# Patient Record
Sex: Male | Born: 1985
Health system: Southern US, Community
[De-identification: ages and names within clinical notes are randomized; demographics above are authoritative.]

## PROBLEM LIST (undated history)

## (undated) DIAGNOSIS — E162 Hypoglycemia, unspecified: Secondary | ICD-10-CM

## (undated) DIAGNOSIS — B20 Human immunodeficiency virus [HIV] disease: Secondary | ICD-10-CM

## (undated) DIAGNOSIS — F1290 Cannabis use, unspecified, uncomplicated: Secondary | ICD-10-CM

## (undated) DIAGNOSIS — T7840XA Allergy, unspecified, initial encounter: Secondary | ICD-10-CM

## (undated) HISTORY — DX: Human immunodeficiency virus (HIV) disease: B20

## (undated) HISTORY — DX: Cannabis use, unspecified, uncomplicated: F12.90

## (undated) HISTORY — DX: Hypoglycemia, unspecified: E16.2

## (undated) HISTORY — DX: Allergy, unspecified, initial encounter: T78.40XA

---
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
## 2007-02-20 DIAGNOSIS — J45990 Exercise induced bronchospasm: Secondary | ICD-10-CM

## 2007-11-04 ENCOUNTER — Encounter: Payer: Self-pay | Admitting: *Deleted

## 2007-11-24 ENCOUNTER — Encounter: Admission: RE | Admit: 2007-11-24 | Discharge: 2007-11-24 | Payer: Self-pay | Admitting: Family Medicine

## 2007-11-24 ENCOUNTER — Ambulatory Visit: Payer: Self-pay | Admitting: Family Medicine

## 2008-05-01 ENCOUNTER — Emergency Department (HOSPITAL_COMMUNITY): Admission: EM | Admit: 2008-05-01 | Discharge: 2008-05-01 | Payer: Self-pay | Admitting: Emergency Medicine

## 2008-08-10 ENCOUNTER — Ambulatory Visit: Payer: Self-pay | Admitting: Family Medicine

## 2008-08-10 DIAGNOSIS — L708 Other acne: Secondary | ICD-10-CM

## 2008-08-10 DIAGNOSIS — L709 Acne, unspecified: Secondary | ICD-10-CM | POA: Insufficient documentation

## 2008-09-13 ENCOUNTER — Emergency Department (HOSPITAL_COMMUNITY): Admission: EM | Admit: 2008-09-13 | Discharge: 2008-09-13 | Payer: Self-pay | Admitting: Emergency Medicine

## 2010-05-30 ENCOUNTER — Encounter: Payer: Self-pay | Admitting: Family Medicine

## 2010-05-30 ENCOUNTER — Ambulatory Visit: Payer: Self-pay | Admitting: Family Medicine

## 2010-05-30 DIAGNOSIS — R5381 Other malaise: Secondary | ICD-10-CM | POA: Insufficient documentation

## 2010-05-30 DIAGNOSIS — M549 Dorsalgia, unspecified: Secondary | ICD-10-CM | POA: Insufficient documentation

## 2010-05-30 DIAGNOSIS — L03317 Cellulitis of buttock: Secondary | ICD-10-CM

## 2010-05-30 DIAGNOSIS — R5383 Other fatigue: Secondary | ICD-10-CM

## 2010-05-30 DIAGNOSIS — L0231 Cutaneous abscess of buttock: Secondary | ICD-10-CM

## 2010-05-30 LAB — CONVERTED CEMR LAB
ALT: 14 units/L (ref 0–53)
AST: 16 units/L (ref 0–37)
Albumin: 4.2 g/dL (ref 3.5–5.2)
Alkaline Phosphatase: 62 units/L (ref 39–117)
BUN: 15 mg/dL (ref 6–23)
Basophils Absolute: 0 10*3/uL (ref 0.0–0.1)
Basophils Relative: 0 % (ref 0–1)
CO2: 21 meq/L (ref 19–32)
Calcium: 8.9 mg/dL (ref 8.4–10.5)
Chlamydia, DNA Probe: NEGATIVE
Chloride: 105 meq/L (ref 96–112)
Creatinine, Ser: 0.9 mg/dL (ref 0.40–1.50)
Eosinophils Absolute: 0.4 10*3/uL (ref 0.0–0.7)
Eosinophils Relative: 3 % (ref 0–5)
GC Probe Amp, Genital: NEGATIVE
Glucose, Bld: 83 mg/dL (ref 70–99)
HCT: 44.1 % (ref 39.0–52.0)
Hemoglobin: 14.2 g/dL (ref 13.0–17.0)
Lymphocytes Relative: 15 % (ref 12–46)
Lymphs Abs: 1.7 10*3/uL (ref 0.7–4.0)
MCHC: 32.2 g/dL (ref 30.0–36.0)
MCV: 82.9 fL (ref 78.0–100.0)
Monocytes Absolute: 1 10*3/uL (ref 0.1–1.0)
Monocytes Relative: 9 % (ref 3–12)
Neutro Abs: 8.2 10*3/uL — ABNORMAL HIGH (ref 1.7–7.7)
Neutrophils Relative %: 72 % (ref 43–77)
Potassium: 4.2 meq/L (ref 3.5–5.3)
RBC: 5.32 M/uL (ref 4.22–5.81)
RDW: 14.3 % (ref 11.5–15.5)
Sodium: 140 meq/L (ref 135–145)
Total Bilirubin: 0.6 mg/dL (ref 0.3–1.2)
Total Protein: 7 g/dL (ref 6.0–8.3)
WBC: 11.4 10*3/uL — ABNORMAL HIGH (ref 4.0–10.5)

## 2010-09-21 ENCOUNTER — Encounter: Payer: Self-pay | Admitting: Family Medicine

## 2010-10-31 ENCOUNTER — Telehealth (INDEPENDENT_AMBULATORY_CARE_PROVIDER_SITE_OTHER): Payer: Self-pay | Admitting: *Deleted

## 2010-11-01 ENCOUNTER — Emergency Department (HOSPITAL_COMMUNITY): Admission: EM | Admit: 2010-11-01 | Discharge: 2010-11-01 | Payer: Self-pay | Admitting: Family Medicine
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
## 2011-01-23 NOTE — Miscellaneous (Signed)
   Clinical Lists Changes  Problems: Changed problem from ASTHMA, UNSPECIFIED (ICD-493.90) to ASTHMA, INTERMITTENT (ICD-493.90) 

## 2011-01-23 NOTE — Miscellaneous (Signed)
Summary: Consent I&D of abscess  Consent I&D of abscess   Imported By: Clydell Hakim 06/02/2010 15:36:58  _____________________________________________________________________  External Attachment:    Type:   Image     Comment:   External Document

## 2011-01-23 NOTE — Progress Notes (Signed)
   Phone Note Call from Patient   Caller: Patient Call For: [phone] Summary of Call: Pt would like to have lab results from appt in June Initial call taken by: Abundio Miu,  October 31, 2010 8:37 AM  Follow-up for Phone Call        called back line busy. Follow-up by: Theresia Lo RN,  October 31, 2010 2:37 PM  Additional Follow-up for Phone Call Additional follow up Details #1::        spoke with patient and advised that labs from June were within normal range and  other labs were negative for GC and chlymadia. he states he has  cyst that have formed in groin area againwith much pain . advised patient that he needs to be seen today. he is on his way to work and cannot come today.  he has no insurance and I checked with Denny Peon in front office and she advises we will not be able to see as he was given [facility] information at last visit and has not  paid any on last office visit, exlpained to patient that he will need to go to urgent care . advised to get in touch with Jaynee Eagles or apply for medicaid.. , again advised to go to urgent care today. Additional Follow-up by: Theresia Lo RN,  October 31, 2010 3:07 PM

## 2011-03-01 ENCOUNTER — Inpatient Hospital Stay (INDEPENDENT_AMBULATORY_CARE_PROVIDER_SITE_OTHER)
Admission: RE | Admit: 2011-03-01 | Discharge: 2011-03-01 | Disposition: A | Discharge status: Home / Self Care | Payer: Self-pay | Source: Ambulatory Visit | Attending: Family Medicine | Admitting: Family Medicine

## 2011-03-01 DIAGNOSIS — L02219 Cutaneous abscess of trunk, unspecified: Secondary | ICD-10-CM

## 2011-03-01 DIAGNOSIS — H1089 Other conjunctivitis: Secondary | ICD-10-CM

## 2011-03-01 DIAGNOSIS — J069 Acute upper respiratory infection, unspecified: Secondary | ICD-10-CM

## 2011-03-04 LAB — CULTURE, ROUTINE-ABSCESS

## 2011-03-06 LAB — CULTURE, ROUTINE-ABSCESS

## 2011-04-25 ENCOUNTER — Inpatient Hospital Stay (INDEPENDENT_AMBULATORY_CARE_PROVIDER_SITE_OTHER)
Admission: RE | Admit: 2011-04-25 | Discharge: 2011-04-25 | Disposition: A | Discharge status: Home / Self Care | Payer: Self-pay | Source: Ambulatory Visit | Attending: Family Medicine | Admitting: Family Medicine

## 2011-04-25 DIAGNOSIS — L989 Disorder of the skin and subcutaneous tissue, unspecified: Secondary | ICD-10-CM

## 2011-04-25 DIAGNOSIS — L0292 Furuncle, unspecified: Secondary | ICD-10-CM

## 2011-04-25 LAB — HIV ANTIBODY (ROUTINE TESTING W REFLEX): HIV: NONREACTIVE

## 2011-04-26 LAB — GC/CHLAMYDIA PROBE AMP, GENITAL
Chlamydia, DNA Probe: NEGATIVE
GC Probe Amp, Genital: NEGATIVE

## 2011-04-27 LAB — HERPES SIMPLEX VIRUS CULTURE: Culture: DETECTED

## 2011-05-08 ENCOUNTER — Inpatient Hospital Stay (INDEPENDENT_AMBULATORY_CARE_PROVIDER_SITE_OTHER)
Admission: RE | Admit: 2011-05-08 | Discharge: 2011-05-08 | Disposition: A | Discharge status: Home / Self Care | Payer: Self-pay | Source: Ambulatory Visit | Attending: Family Medicine | Admitting: Family Medicine

## 2011-05-08 DIAGNOSIS — L03319 Cellulitis of trunk, unspecified: Secondary | ICD-10-CM

## 2011-05-11 LAB — CULTURE, ROUTINE-ABSCESS

## 2011-05-11 NOTE — Discharge Summary (Signed)
NAMECHRISTYAN, [name]                        ACCOUNT NO.:  [id number]   MEDICAL RECORD NO.:  [id number]                   PATIENT TYPE:  INP   LOCATION:  6121                                 FACILITY:  [facility]   PHYSICIAN:  Leighton Roach McDiarmid, M.D.             DATE OF BIRTH:  [birth year]   DATE OF ADMISSION:  08/31/2003  DATE OF DISCHARGE:  09/02/2003                                 DISCHARGE SUMMARY   DISCHARGE DIAGNOSIS:  Acute asthma exacerbation.   DISCHARGE MEDICATIONS:  1. Singulair 10 mg p.o. q.h.s.  2. Prednisone 40 mg p.o. b.i.d. on September 03, 2003 and September 04, 2003     to complete a five day course.  3. Advair 250/50 Diskus one puff b.i.d.  4. Flonase nasal spray one spray each nostril q.a.m.  5. Albuterol MDI one to two puffs p.r.n. shortness of breath.   ACTIVITY:  As tolerated.   DIET:  No restrictions.   SPECIAL INSTRUCTIONS:  Check peak flows every morning. Record them and bring  these records to your doctor's appointments. Followup with Dr. Linford Arnold on  Thursday, September 16 at 11:35 a.m. at the [facility].   BRIEF ADMISSION HISTORY:  The patient is a 25 year old black male with a  history of asthma who presented to [facility] with a three day  history of increased shortness of breath, cough, wheezing, and decreased  activity. He started having URI symptoms four days prior to admission. He  was seen in the ER the night prior to admission, received nebulizer  treatments, started on prednisone, instructed to restart his Singulair, and  was discharged home. He had been using his albuterol nebulizers every two  hours at home prior to being seen in the ER. At the [facility],  he was initially found to have an O2 saturation of 86% on room air, received  albuterol and Atrovent nebulizer treatments x3 with improvement to 90 to 91%  on room air. Peak flows initially were 70, 90, 120, and only improved to 135  after three  nebulizers. Denied fever, chills, nausea, vomiting, diarrhea.  Talked about the people who smoke outside the home and had recently been  around more smokers.   On exam, he was breathing 30 times a minute, pulse of 110. He appeared  nontoxic but tired. His TMs were pearly gray. He had minimal posterior  oropharyngeal erythema, moist mucous membranes. No lymphadenopathy. Diffuse  wheezes with decreased breath sounds that only slightly improved after  nebulizer treatment. He had fair respiratory effort and minimal distress. A  rapid strep done in clinic was negative. As he had minimal improvement  status post three nebulizers, he was admitted for monitoring and treatment.   HOSPITAL COURSE:  The day of admission and over the first night, the patient  required frequent nebulizer treatments and O2 by face mask to maintain  saturations in the low 90s. He  was continued on prednisone. It was deemed  that his asthma exacerbation was likely secondary to a viral URI. He  continued to improve clinically and tolerated spacing out of his nebulizer  treatments. His pre and post nebulizer treatment peak flows continued to  improve, so that by the day of discharge, his peak flows were in his green  zone on his asthma action plan. Throughout his hospitalization, he remained  afebrile, and on the day of discharge, he was saturating 99% on room air  with end-expiratory wheezes but good air flow and no respiratory distress.  He was deemed ready for discharge.  During hospitalization, he and his mother did receive further asthma  education. He was instructed on peak flows and correct usage of his asthma  medication. He did have an albuterol MDI with spacer at home and therefore  was only discharged with a MDI and instructed to use his at-home spacer.      Georgina Peer, M.D.                 Etta Grandchild, M.D.    JM/MEDQ  D:  10/18/2003  T:  10/19/2003  Job:  [id number]

## 2011-06-07 ENCOUNTER — Inpatient Hospital Stay (INDEPENDENT_AMBULATORY_CARE_PROVIDER_SITE_OTHER)
Admission: RE | Admit: 2011-06-07 | Discharge: 2011-06-07 | Disposition: A | Discharge status: Home / Self Care | Payer: Self-pay | Source: Ambulatory Visit | Attending: Family Medicine | Admitting: Family Medicine

## 2011-06-07 DIAGNOSIS — L02419 Cutaneous abscess of limb, unspecified: Secondary | ICD-10-CM

## 2011-06-10 LAB — CULTURE, ROUTINE-ABSCESS

## 2012-02-28 ENCOUNTER — Other Ambulatory Visit: Payer: Self-pay | Admitting: Family Medicine

## 2012-04-18 ENCOUNTER — Encounter: Payer: Self-pay | Admitting: Family Medicine

## 2012-04-18 ENCOUNTER — Ambulatory Visit (INDEPENDENT_AMBULATORY_CARE_PROVIDER_SITE_OTHER): Payer: BC Managed Care – PPO | Admitting: Family Medicine

## 2012-04-18 VITALS — BP 131/80 | HR 88 | Ht 65.0 in | Wt 158.0 lb

## 2012-04-18 DIAGNOSIS — Z0000 Encounter for general adult medical examination without abnormal findings: Secondary | ICD-10-CM

## 2012-04-18 DIAGNOSIS — J45909 Unspecified asthma, uncomplicated: Secondary | ICD-10-CM

## 2012-04-18 DIAGNOSIS — J309 Allergic rhinitis, unspecified: Secondary | ICD-10-CM

## 2012-04-18 DIAGNOSIS — Z833 Family history of diabetes mellitus: Secondary | ICD-10-CM

## 2012-04-18 DIAGNOSIS — J302 Other seasonal allergic rhinitis: Secondary | ICD-10-CM

## 2012-04-18 DIAGNOSIS — Z113 Encounter for screening for infections with a predominantly sexual mode of transmission: Secondary | ICD-10-CM | POA: Insufficient documentation

## 2012-04-18 DIAGNOSIS — Z202 Contact with and (suspected) exposure to infections with a predominantly sexual mode of transmission: Secondary | ICD-10-CM

## 2012-04-18 LAB — BASIC METABOLIC PANEL
BUN: 17 mg/dL (ref 6–23)
CO2: 25 mEq/L (ref 19–32)
Glucose, Bld: 82 mg/dL (ref 70–99)
Potassium: 4.1 mEq/L (ref 3.5–5.3)
Sodium: 136 mEq/L (ref 135–145)

## 2012-04-18 MED ORDER — FLUTICASONE PROPIONATE 50 MCG/ACT NA SUSP: 2.0000 | Freq: Every day | NASAL | Status: DC

## 2012-04-18 MED ORDER — ALBUTEROL SULFATE HFA 108 (90 BASE) MCG/ACT IN AERS: 2.0000 | INHALATION_SPRAY | RESPIRATORY_TRACT | Status: DC | PRN

## 2012-04-18 MED ORDER — BECLOMETHASONE DIPROPIONATE 80 MCG/ACT IN AERS: 1.0000 | INHALATION_SPRAY | Freq: Two times a day (BID) | RESPIRATORY_TRACT | Status: DC

## 2012-04-18 NOTE — Assessment & Plan Note (Signed)
Well adult exam today. 1) asthma: Patient has active persistent asthma currently not ideally controlled. Plan to start Qvar and continue albuterol followup as needed 2) hypertension and diabetes screening. Blood pressure within normal range today. Patient is fasting. Will assess a basic metabolic panel. 3) STI: Potential for sexual transmitted infections. We'll check HIV and syphilis.  Discussed that these are in a body cast he will require subsequent testing in the future. Advised continued use of condoms he expresses understanding. 4) seasonal allergies: Flonase Zyrtec and ophthalmic antihistamine.  5) ingrown hair: Advised against shaving his legs.

## 2012-04-18 NOTE — Progress Notes (Signed)
[name] is a 26 y.o. male who presents to [facility] today for a physical.  1) patient notes worsening seasonal allergies and asthma recently: Patient has intermittent asthma and usually only has to use the albuterol inhaler one or 2 times a month. However over the last several weeks he has had use the albuterol inhaler twice a day. He denies any uncontrollable asthma but does have frequent wheezing. He additionally notes itchy watery eyes nasal discharge and sneezing. He feels well otherwise.  He is not on any controller medications.  2) screening for hypertension and diabetes: He has both hypertension and diabetes in first-degree relatives he would like to be assessed for this today. No chest pain palpitations or polyuria or polydipsia.  3) sexually transmitted infection evaluation: Patient has sex with both men and women almost always wears a condom. He does note that a partner of his recently tested positive for secondary syphilis. If that approximately one month since the sexual encounter with this individual and he would like to be assessed for both HIV and syphilis. He denies any penile discharge or sores in feels well otherwise.  4) ingrown hairs: Patient has had several ingrown hairs on his inner thighs.  He knows that they're often painful and sometimes get red and swollen. Currently they feel well. He does occasionally shave that area.    PMH: Significant for intermittent asthma.  PSH: None FH: Update electronic medical record significant for hypertension SH: Patient is a nonsmoker. He works at [employer]. Bisexual ROS as above: No chest pain palpitations fevers chills abdominal pain nausea vomiting or diarrhea Medications reviewed. Current Outpatient Prescriptions  Medication Sig Dispense Refill  . albuterol (PROAIR HFA) 108 (90 BASE) MCG/ACT inhaler Inhale 2 puffs into the lungs every 4 (four) hours as needed. For dyspnea.  1 Inhaler  3  . DISCONTD: albuterol (PROAIR HFA) 108  (90 BASE) MCG/ACT inhaler Inhale 2 puffs into the lungs every 4 (four) hours as needed. For dyspnea.       . beclomethasone (QVAR) 80 MCG/ACT inhaler Inhale 1 puff into the lungs 2 (two) times daily.  1 Inhaler  12  . fluticasone (FLONASE) 50 MCG/ACT nasal spray Place 2 sprays into the nose daily.  16 g  6    Exam:  BP 131/80  Pulse 88  Ht 5\' 5"  (1.651 m)  Wt 158 lb (71.668 kg)  BMI 26.29 kg/m2 Gen: Well NAD HEENT: EOMI,  MMM conjunctiva will injection mildly bilaterally without discharge Lungs: CTABL Nl WOB Heart: RRR no MRG Abd: NABS, NT, ND Exts: Non edematous BL  LE, warm and well perfused.  Skin: Thickened skin and scar is on the inner thigh characteristic of previous ingrown hairs. No erythema or induration.  Genitals: No penile sores or discharge.  No results found for this or any previous visit (from the past 72 hour(s)).

## 2012-04-18 NOTE — Patient Instructions (Signed)
Thank you for coming in today. FOr allergies. Please take zyrtec every day.  Also take flonase nasal spray and the [place]ofen eye drops daily. Look for antihistamine eye drops.  We will test for HIV and Syphillis. You should have an HIV test every 6 months.  You have ingrown hairs on your thighs. Do not shave that area.  For asthma. Please start QVAR twice a day. Let me know if this does not help.  I will call you about your test results. I will leave a message.

## 2012-04-21 ENCOUNTER — Telehealth: Payer: Self-pay | Admitting: Family Medicine

## 2012-04-21 NOTE — Telephone Encounter (Signed)
Called patient back about results

## 2012-07-19 ENCOUNTER — Emergency Department (HOSPITAL_COMMUNITY): Payer: BC Managed Care – PPO

## 2012-07-19 ENCOUNTER — Inpatient Hospital Stay (HOSPITAL_COMMUNITY)
Admission: EM | Admit: 2012-07-19 | Discharge: 2012-07-21 | DRG: 097 | Disposition: A | Discharge status: Home / Self Care | Payer: BC Managed Care – PPO | Attending: Family Medicine | Admitting: Family Medicine

## 2012-07-19 ENCOUNTER — Encounter (HOSPITAL_COMMUNITY): Payer: Self-pay | Admitting: *Deleted

## 2012-07-19 DIAGNOSIS — J45901 Unspecified asthma with (acute) exacerbation: Secondary | ICD-10-CM

## 2012-07-19 DIAGNOSIS — R0789 Other chest pain: Secondary | ICD-10-CM | POA: Diagnosis present

## 2012-07-19 DIAGNOSIS — R000 Tachycardia, unspecified: Secondary | ICD-10-CM

## 2012-07-19 DIAGNOSIS — J45990 Exercise induced bronchospasm: Secondary | ICD-10-CM | POA: Diagnosis present

## 2012-07-19 DIAGNOSIS — Z23 Encounter for immunization: Secondary | ICD-10-CM

## 2012-07-19 MED ORDER — ALBUTEROL SULFATE (5 MG/ML) 0.5% IN NEBU
5.0000 mg | INHALATION_SOLUTION | Freq: Once | RESPIRATORY_TRACT | Status: AC
  Administered 2012-07-19: 5 mg via RESPIRATORY_TRACT
  Filled 2012-07-19: qty 1

## 2012-07-19 NOTE — ED Notes (Signed)
Checked spo2 and HR at RN first- spo2 88-89% and HR 125; pt with exp wheezing; brought to triage and notified triage RN

## 2012-07-19 NOTE — ED Notes (Signed)
Pt states that he has a cold. Pt states hx of asthma. Pt ery tight breathing, pursed lips, pt able to talk with a few long breaths to continue talking. Pt alert and oriented.

## 2012-07-20 LAB — CBC WITH DIFFERENTIAL/PLATELET
Basophils Absolute: 0 10*3/uL (ref 0.0–0.1)
Basophils Relative: 0 % (ref 0–1)
Eosinophils Absolute: 0.3 10*3/uL (ref 0.0–0.7)
Eosinophils Relative: 3 % (ref 0–5)
HCT: 46.5 % (ref 39.0–52.0)
Lymphocytes Relative: 14 % (ref 12–46)
MCH: 27.8 pg (ref 26.0–34.0)
MCHC: 34.4 g/dL (ref 30.0–36.0)
MCV: 80.7 fL (ref 78.0–100.0)
Monocytes Absolute: 0.8 10*3/uL (ref 0.1–1.0)
Platelets: 127 10*3/uL — ABNORMAL LOW (ref 150–400)
RDW: 14.4 % (ref 11.5–15.5)

## 2012-07-20 LAB — BASIC METABOLIC PANEL
CO2: 25 mEq/L (ref 19–32)
Calcium: 9.3 mg/dL (ref 8.4–10.5)
Creatinine, Ser: 0.81 mg/dL (ref 0.50–1.35)
GFR calc non Af Amer: >90 mL/min (ref >90)

## 2012-07-20 LAB — MRSA PCR SCREENING: MRSA by PCR: NEGATIVE

## 2012-07-20 MED ORDER — PREDNISONE 20 MG PO TABS
60.0000 mg | ORAL_TABLET | Freq: Once | ORAL | Status: AC
  Administered 2012-07-20: 60 mg via ORAL
  Filled 2012-07-20: qty 3

## 2012-07-20 MED ORDER — IPRATROPIUM BROMIDE 0.02 % IN SOLN
0.5000 mg | Freq: Four times a day (QID) | RESPIRATORY_TRACT | Status: DC
Start: 2012-07-20 — End: 2012-07-21
  Administered 2012-07-20 – 2012-07-21 (×5): 0.5 mg via RESPIRATORY_TRACT
  Filled 2012-07-20 (×5): qty 2.5

## 2012-07-20 MED ORDER — ONDANSETRON HCL 4 MG/2ML IJ SOLN
4.0000 mg | Freq: Four times a day (QID) | INTRAMUSCULAR | Status: DC | PRN
Start: 2012-07-20 — End: 2012-07-21

## 2012-07-20 MED ORDER — ONDANSETRON HCL 4 MG PO TABS: 4.0000 mg | ORAL_TABLET | Freq: Four times a day (QID) | ORAL | Status: DC | PRN

## 2012-07-20 MED ORDER — PHENOL 1.4 % MT LIQD: 1.0000 | OROMUCOSAL | Status: DC | PRN

## 2012-07-20 MED ORDER — IPRATROPIUM BROMIDE 0.02 % IN SOLN
0.5000 mg | Freq: Once | RESPIRATORY_TRACT | Status: AC
  Administered 2012-07-20: 0.5 mg via RESPIRATORY_TRACT
  Filled 2012-07-20: qty 2.5

## 2012-07-20 MED ORDER — ENOXAPARIN SODIUM 40 MG/0.4ML ~~LOC~~ SOLN
40.0000 mg | Freq: Every day | SUBCUTANEOUS | Status: DC
  Administered 2012-07-20 – 2012-07-21 (×2): 40 mg via SUBCUTANEOUS
  Filled 2012-07-20 (×2): qty 0.4

## 2012-07-20 MED ORDER — MORPHINE SULFATE 2 MG/ML IJ SOLN: 1.0000 mg | INTRAMUSCULAR | Status: DC | PRN

## 2012-07-20 MED ORDER — ALBUTEROL SULFATE (5 MG/ML) 0.5% IN NEBU
2.5000 mg | INHALATION_SOLUTION | RESPIRATORY_TRACT | Status: DC | PRN
  Filled 2012-07-20: qty 1

## 2012-07-20 MED ORDER — ACETAMINOPHEN 650 MG RE SUPP: 650.0000 mg | Freq: Four times a day (QID) | RECTAL | Status: DC | PRN

## 2012-07-20 MED ORDER — PNEUMOCOCCAL VAC POLYVALENT 25 MCG/0.5ML IJ INJ
0.5000 mL | INJECTION | INTRAMUSCULAR | Status: AC
  Administered 2012-07-21: 0.5 mL via INTRAMUSCULAR
  Filled 2012-07-20: qty 0.5

## 2012-07-20 MED ORDER — KETOROLAC TROMETHAMINE 30 MG/ML IJ SOLN
30.0000 mg | Freq: Four times a day (QID) | INTRAMUSCULAR | Status: AC
  Administered 2012-07-20 (×3): 30 mg via INTRAVENOUS
  Filled 2012-07-20 (×3): qty 1

## 2012-07-20 MED ORDER — ALBUTEROL SULFATE (5 MG/ML) 0.5% IN NEBU
5.0000 mg | INHALATION_SOLUTION | RESPIRATORY_TRACT | Status: DC
  Administered 2012-07-20 – 2012-07-21 (×5): 5 mg via RESPIRATORY_TRACT
  Filled 2012-07-20 (×10): qty 0.5

## 2012-07-20 MED ORDER — ALBUTEROL SULFATE (5 MG/ML) 0.5% IN NEBU
5.0000 mg | INHALATION_SOLUTION | RESPIRATORY_TRACT | Status: DC | PRN
  Administered 2012-07-20 – 2012-07-21 (×4): 5 mg via RESPIRATORY_TRACT
  Filled 2012-07-20: qty 0.5
  Filled 2012-07-20: qty 1

## 2012-07-20 MED ORDER — ACETAMINOPHEN 325 MG PO TABS: 650.0000 mg | ORAL_TABLET | Freq: Four times a day (QID) | ORAL | Status: DC | PRN

## 2012-07-20 MED ORDER — SODIUM CHLORIDE 0.9 % IJ SOLN
3.0000 mL | Freq: Two times a day (BID) | INTRAMUSCULAR | Status: DC
  Administered 2012-07-21: 3 mL via INTRAVENOUS

## 2012-07-20 MED ORDER — ALBUTEROL SULFATE (5 MG/ML) 0.5% IN NEBU
5.0000 mg | INHALATION_SOLUTION | Freq: Once | RESPIRATORY_TRACT | Status: AC
  Administered 2012-07-20: 5 mg via RESPIRATORY_TRACT
  Filled 2012-07-20: qty 1

## 2012-07-20 MED ORDER — ALBUTEROL (5 MG/ML) CONTINUOUS INHALATION SOLN
15.0000 mg/h | INHALATION_SOLUTION | RESPIRATORY_TRACT | Status: AC
  Administered 2012-07-20: 15 mg/h via RESPIRATORY_TRACT
  Filled 2012-07-20: qty 20

## 2012-07-20 MED ORDER — ALBUTEROL (5 MG/ML) CONTINUOUS INHALATION SOLN
10.0000 mg/h | INHALATION_SOLUTION | Freq: Once | RESPIRATORY_TRACT | Status: AC
  Administered 2012-07-20: 10 mg/h via RESPIRATORY_TRACT
  Filled 2012-07-20: qty 20

## 2012-07-20 MED ORDER — ALBUTEROL SULFATE (5 MG/ML) 0.5% IN NEBU
5.0000 mg | INHALATION_SOLUTION | RESPIRATORY_TRACT | Status: DC
  Administered 2012-07-20 (×6): 5 mg via RESPIRATORY_TRACT
  Filled 2012-07-20: qty 0.5
  Filled 2012-07-20: qty 1
  Filled 2012-07-20 (×2): qty 0.5

## 2012-07-20 MED ORDER — FLUTICASONE PROPIONATE HFA 44 MCG/ACT IN AERO
1.0000 | INHALATION_SPRAY | Freq: Two times a day (BID) | RESPIRATORY_TRACT | Status: DC
  Filled 2012-07-20 (×2): qty 10.6

## 2012-07-20 MED ORDER — POTASSIUM CHLORIDE IN NACL 20-0.9 MEQ/L-% IV SOLN
INTRAVENOUS | Status: DC
  Administered 2012-07-20: 05:00:00 via INTRAVENOUS
  Administered 2012-07-20: 125 mL/h via INTRAVENOUS
  Administered 2012-07-21 (×2): via INTRAVENOUS
  Filled 2012-07-20 (×8): qty 1000

## 2012-07-20 MED ORDER — ALBUTEROL SULFATE (5 MG/ML) 0.5% IN NEBU
5.0000 mg | INHALATION_SOLUTION | RESPIRATORY_TRACT | Status: DC
  Filled 2012-07-20: qty 1

## 2012-07-20 MED ORDER — PREDNISONE 50 MG PO TABS
50.0000 mg | ORAL_TABLET | Freq: Every day | ORAL | Status: DC
  Administered 2012-07-20 – 2012-07-21 (×2): 50 mg via ORAL
  Filled 2012-07-20 (×4): qty 1

## 2012-07-20 NOTE — ED Provider Notes (Signed)
History     CSN: [id number]  Arrival date & time 07/19/12  2321   First MD Initiated Contact with Patient 07/20/12 0021      Chief Complaint  Patient presents with  . Shortness of Breath  . Asthma    (Consider location/radiation/quality/duration/timing/severity/associated sxs/prior treatment) HPI 26 year old male with history of asthma presents to the emergency department with report of asthma exacerbation. Patient reports cold symptoms with dry scratchy throat for the last few days. He reports normally when he gets a cold he usually has a bad has a flare. Patient reports his asthma is usually only exercise-induced, and is controlled well with albuterol. He's been using his albuterol MDI without improvement in symptoms. No fever no chills. Patient denies smoking. Last admission was about 3 years ago.  Past Medical History  Diagnosis Date  . Allergy   . Asthma     History reviewed. No pertinent past surgical history.  Family History  Problem Relation Age of Onset  . Hypertension Mother     History  Substance Use Topics  . Smoking status: Never Smoker   . Smokeless tobacco: Never Used  . Alcohol Use: No      Review of Systems  All other systems reviewed and are negative.   other than listed in history of present illness  Allergies  Review of patient's allergies indicates no known allergies.  Home Medications   Current Outpatient Rx  Name Route Sig Dispense Refill  . ALBUTEROL SULFATE HFA 108 (90 BASE) MCG/ACT IN AERS Inhalation Inhale 2 puffs into the lungs every 4 (four) hours as needed. For dyspnea. 1 Inhaler 3    BP 144/80  Pulse 110  Temp 97.7 F (36.5 C) (Oral)  Resp 18  SpO2 89%  Physical Exam  Nursing note and vitals reviewed. Constitutional: He is oriented to person, place, and time. He appears well-developed and well-nourished. He appears distressed (moderate respiratory distress).  HENT:  Head: Normocephalic and atraumatic.  Mouth/Throat:  Oropharynx is clear and moist. No oropharyngeal exudate (Posterior oropharynx is erythematous).  Neck: Normal range of motion. Neck supple. No JVD present. No tracheal deviation present. No thyromegaly present.  Cardiovascular: Normal rate, regular rhythm, normal heart sounds and intact distal pulses.  Exam reveals no gallop and no friction rub.   No murmur heard. Pulmonary/Chest: No stridor. He is in respiratory distress (moderate , speaking in short sentences). He has wheezes. He has no rales. He exhibits no tenderness.  Abdominal: Soft. Bowel sounds are normal. He exhibits no distension and no mass. There is no tenderness. There is no rebound and no guarding.  Musculoskeletal: Normal range of motion. He exhibits no edema and no tenderness.  Lymphadenopathy:    He has no cervical adenopathy.  Neurological: He is alert and oriented to person, place, and time. He exhibits normal muscle tone. Coordination normal.  Skin: Skin is warm and dry. No rash noted. No erythema. No pallor.  Psychiatric: He has a normal mood and affect. His behavior is normal. Judgment and thought content normal.    ED Course  Procedures (including critical care time)   Labs Reviewed  CBC WITH DIFFERENTIAL  BASIC METABOLIC PANEL   Dg Chest 2 View  07/20/2012  *RADIOLOGY REPORT*  Clinical Data: Shortness of breath  CHEST - 2 VIEW  Comparison: 09/13/2008  Findings: Mild peribronchial thickening is similar to prior.  No focal consolidation.  No pleural effusion or pneumothorax. Cardiomediastinal contours within normal range.  No acute osseous finding.  IMPRESSION:  Mild peribronchial cuffing may reflect reactive airway disease or viral infection.  No focal consolidation.  Original Report Authenticated By: Waneta Martins, M.D.     1. Asthma exacerbation       MDM  26 year old male with asthma exacerbation. Patient has artery received one treatment prior to me seeing him, still with extensive wheezing and  decreased air exchange. Patient without improvement after second treatment. To be placed on hour-long continuous neb, will discuss with  family practice for admission.        Olivia Mackie, MD 07/20/12 [phone]

## 2012-07-20 NOTE — Progress Notes (Signed)
[facility]PT[facility] PGY-1 Update Note  Pt re-examined with Dr. Gwenlyn Saran. Pt states he feels much better and breathing is much improved. Would like to go home tonight but understands we want to watch him closely at least overnight.  Objectively, lung fields are much clearer to auscultation, with only occasional scattered wheezes. Pt does remain tachycardic in the 120's, but this is slightly improved and not entirely unexpected, given how much albuterol pt has had since early this morning. He has not required any PRN albuterol beyond his scheduled q2 dosing since 0400.  Plan for now is to transfer to tele floor (out of stepdown) and continue to monitor with likely discharge tomorrow. Will continue IS, O2 as needed, and albuterol q2/q1 with ipratropium q6. Fluticasone not administered this morning per [facility], scheduled for 2000, tonight then BID starting tomorrow.

## 2012-07-20 NOTE — Progress Notes (Signed)
07/20/12 0500 notified Dr Elwyn Reach patients hr in 130s to 140s, and complains of sob unrelieved by breathing treatments, orders received, will continue to monitor.  [name]

## 2012-07-20 NOTE — H&P (Signed)
FMTS Attending Admission Note: Samuel Levy MD [phone] pager office [phone] I  have seen and examined this patient, reviewed their chart. I have discussed this patient with the resident. I agree with the resident's findings, assessment and care plan. Asthma does not sound well controlled at home--he has been unable to afford his medication other than albuterol. He has cough every night and increased symptoms with exertion so we plan on exploring ways for him to get on controller med on d/c such as flovent. He will likely be following up with our practice. He anted to go home now but I feel he is not quite ready yet and will hold until AM. He will need a work note.

## 2012-07-20 NOTE — Progress Notes (Signed)
RT Note;15mg  cont neb x1 hr given,pt tol well.

## 2012-07-20 NOTE — Plan of Care (Signed)
Problem: Phase I Progression Outcomes Goal: Hemodynamically stable Outcome: Progressing Hr still in 120'-130's

## 2012-07-20 NOTE — Progress Notes (Signed)
FPTS PGY-1 Update Note  Pt seen at bedside. States he "still feels uncomfortable, but better than when he came in." Still feels wheezy but is able to breath better after breathing treatments; pt had completed second CAT about 1 hour before my visit. Otherwise denies any chest pain and is not particularly short of breath. No other complaints.  Also coincidentally introduced myself as his new PCP, now that Dr. Denyse Amass has graduated the [facility] residency. Pt was very pleasant and stated he looked forward to "staying healthy" with me.  Objective: Vitals: HR in high-120's, low-130's, pt on 2L [place] Gen: Pt sitting up in bed, in NAD, speaking in complete sentences Pulm: slightly increased work of breathing without retractions, mildly decreased airmovement with diffuse inspiratory and expiratory wheezes, worse in upper lung fields with some soft squeaks, clearer in lower lung fields Cardio: increased rate but no obvious murmur or gallop; distal pulses 2+ and intact/symmetric Abd: soft, non-tender, non-distended without guarding Ext: moves extremities equally, sits up in bed without support  Plan: 1. Asthma exacerbation - Hx of multiple admissions requiring intubation in the past, most recently 3 years ago. This exacerbation was ikely triggered by viral infection +/- weather; pt with hx of cold-like symptoms and cough prior to admission, with nighttime cough for the last few months. Pt describes asthma as usually exercise-induced but he does notice more trouble with allergies in spring/fall; not currently taking a controller medication. Hypoxic on admission with some improvement s/p nebulizers x2 and CAT x2 hours. Pt currently feels better with improved breathing but still "uncomfortable." PLAN - Continue Albuterol scheduled q2, PRN q1. Fluticasone 1 puff BID (new medication). Prednisone 50 mg for 5 days (last dose 8/1). O2 as needed. Incentive spirometry.  2. Musculoskeletal chest pain - Most likely due to  respiratory distress/increased work of breathing/tachypnea/etc. Currently no concern for cardiac source. EKG shows tachycardia but not suggestive for MI. PLAN - Toradol IV q6 x3, then switch to oral NSAID. Morphine 1 mg q4 PRN if not relieved by Toradol.  3. Tachycardia - Remains in 120's-130's, 7/28 as of 0915, but pt has received multiple albuterol nebulizers and CATx2. EKG as above. Will monitor.   FEN/GI: NPO for now, advance diet once breathing improves Prophylaxis: Lovenox Dispo: home pending improvement Code Status: Full Code  Bobbye Morton, MD PGY-1, [facility] FPTS Intern pager: [phone]

## 2012-07-20 NOTE — H&P (Signed)
Family Medicine Teaching [facility] Admission History and Physical  Patient name: [name] Medical record number: [id number] Date of birth: [birth year] Age: 26 y.o. Gender: male  Primary Care Provider: Maryjean Ka, MD  Chief Complaint: shortness of breath History of Present Illness: [name] is a 26 y.o. year old male with asthma presenting with a 1 day history of difficulty breathing and wheezing.  He states he was doing fine until 2 nights ago when he started having some cold-like symptoms including sore/scratchy throat and dry cough.  Yesterday, while at work, he reports subjective fever and co-workers telling him he looked "clammy."  He also started feeling short of breath and started wheezing.  Denies any nasal, eye, or ear symptoms.  Does have some diffuse chest pain that is worse when he takes a deep breath.  No radiation or associated nausea, dizziness.  No vomiting, diarrhea, abdominal pain, lower extremity swelling.  States that he has exercise induced asthma and only requires his home albuterol with exercise.  He does report seasonal allergies that tend to make his asthma worse.  Has had multiple hospitalizations and intubations in the past, most recently 3 years ago.  Denies current daytime symptoms, but does report waking pretty much every night recently with cough.  ED course: Hypoxic to 89% on arrival.  DuoNeb x1, albuterol neb x1, CAT x1hr, prednisone.  Patient Active Problem List  Diagnosis  . Asthma, persistent  . CELLULITIS AND ABSCESS OF BUTTOCK  . ACNE, MILD  . BACK PAIN  . FATIGUE  . Seasonal allergies  . Exposure to STD  . Family history of diabetes mellitus  . Well adult exam   Past Medical History: Past Medical History  Diagnosis Date  . Allergy   . Asthma     Past Surgical History: History reviewed. No pertinent past surgical history.  Social History: History   Social History  . Marital Status: Single    Spouse Name: N/A      Number of Children: N/A  . Years of Education: N/A   Social History Main Topics  . Smoking status: Never Smoker   . Smokeless tobacco: Never Used  . Alcohol Use: No  . Drug Use: No  . Sexually Active: No   Other Topics Concern  . None   Social History Narrative   Social history: Has sex with both men and women. Almost always uses a condom. Has had sexual contact with a man who has syphilis however he was using protection.His family notes that he is out and are okay with it.  He is relieved about that.  Does endorse marijuana use, most recently 2 weeks ago  Family History: Family History  Problem Relation Age of Onset  . Hypertension Mother     Allergies: No Known Allergies  Current Facility-Administered Medications  Medication Dose Route Frequency Provider Last Rate Last Dose  . albuterol (PROVENTIL) (5 MG/ML) 0.5% nebulizer solution 5 mg  5 mg Nebulization Once Olivia Mackie, MD   5 mg at 07/19/12 2333  . albuterol (PROVENTIL) (5 MG/ML) 0.5% nebulizer solution 5 mg  5 mg Nebulization Once Olivia Mackie, MD   5 mg at 07/20/12 0055  . albuterol (PROVENTIL,VENTOLIN) solution continuous neb  10 mg/hr Nebulization Once Olivia Mackie, MD   10 mg/hr at 07/20/12 0216  . ipratropium (ATROVENT) nebulizer solution 0.5 mg  0.5 mg Nebulization Once Olivia Mackie, MD   0.5 mg at 07/20/12 0054  . predniSONE (DELTASONE) tablet  60 mg  60 mg Oral Once Olivia Mackie, MD   60 mg at 07/20/12 1610   Current Outpatient Prescriptions  Medication Sig Dispense Refill  . albuterol (PROAIR HFA) 108 (90 BASE) MCG/ACT inhaler Inhale 2 puffs into the lungs every 4 (four) hours as needed. For dyspnea.  1 Inhaler  3   Review Of Systems: Per HPI with the following additions: none Otherwise 12 point review of systems was performed and was unremarkable.  Physical Exam: Pulse: 140  Blood Pressure: 126/69 RR: 28   O2: 99 on CAT Temp: 97.7  General: alert, cooperative, appears stated age, no distress and CAT  running HEENT: PERRLA, extra ocular movement intact, mild conjunctival injection, anicteric, oropharynx clear, no lesions, neck supple with midline trachea and no cervical LAD, mild pharyngeal erthyema Heart: tachycardic, S1, S2 normal, no murmur, rub or gallop, regular rhythm Lungs: normal work of breathing, mildly tachypneic, decreased air movement with inspiratory squeak in bilateral lung bases, good air movement in upper lung fields with no wheezes Abdomen: abdomen is soft without significant tenderness, masses, organomegaly or guarding Extremities: extremities normal, atraumatic, no cyanosis or edema and Homans sign is negative, no sign of DVT Skin:no rashes, no ecchymoses Neurology: normal without focal findings, mental status, speech normal, alert and oriented x3, PERLA and muscle tone and strength normal and symmetric  Labs and Imaging: Lab Results  Component Value Date/Time   NA 140 07/20/2012  2:09 AM   K 3.7 07/20/2012  2:09 AM   CL 103 07/20/2012  2:09 AM   CO2 25 07/20/2012  2:09 AM   BUN 14 07/20/2012  2:09 AM   CREATININE 0.81 07/20/2012  2:09 AM   CREATININE 0.90 04/18/2012  9:52 AM   GLUCOSE 108* 07/20/2012  2:09 AM   Lab Results  Component Value Date   WBC 8.7 07/20/2012   HGB 16.0 07/20/2012   HCT 46.5 07/20/2012   MCV 80.7 07/20/2012   PLT 127* 07/20/2012   CXR: Mild peribronchial cuffing may reflect reactive airway disease or  viral infection. No focal consolidation.  Assessment and Plan: [name] is a 26 y.o. year old male with known history of asthma presenting with asthma exacerbation.  # Asthma exacerbation: Likely triggered by viral infection.  Hypoxic on arrival to ED, some improvement with nebs x2, but did require 1 hour of CAT.  Asthma described as primarily exercise induced, but he does report having more trouble when his allergies start up in the spring and fall.  Multiple admissions and intubations in the past, most recently about 3 years ago.  No  controller at home; reports daily nighttime cough for the last few months. - admit to step down - albuterol q2/q1 - start fluticasone 1 puff BID - prednisone 50mg  daily x5 days (last dose on 8/1) - O2 as needed to keep sats >90% - IS q2hr while awake - chloraseptic spray as needed - will keep NPO for now given history of intubation  # MSK chest pain: Likely related to tachypnea and asthma exacerbation.  No red flags for cardiac source. - will give scheduled toradol x3 doses, then switch to oral anti-inflammatory - morphine 1mg  q4hr prn available if needed - will check EKG in the morning  # Tachycardia: Likely related to respiratory distress and albuterol.  Expect this will improve once requiring less albuterol. - will continue to monitor - EKG in the morning  FEN/GI: NPO except ice chips; NS + 20KCl at 125cc/hr Prophylaxis: SQ lovenox  Disposition: admit to stepdown  [name], [name] 07/20/2012, 3:04 AM

## 2012-07-21 MED ORDER — IBUPROFEN 400 MG PO TABS
400.0000 mg | ORAL_TABLET | Freq: Four times a day (QID) | ORAL | Status: DC | PRN
  Filled 2012-07-21: qty 1

## 2012-07-21 MED ORDER — ONDANSETRON HCL 4 MG PO TABS: 4.0000 mg | ORAL_TABLET | Freq: Four times a day (QID) | ORAL | Status: AC | PRN

## 2012-07-21 MED ORDER — FLUTICASONE PROPIONATE HFA 44 MCG/ACT IN AERO: 1.0000 | INHALATION_SPRAY | RESPIRATORY_TRACT | Status: DC

## 2012-07-21 MED ORDER — IPRATROPIUM BROMIDE 0.02 % IN SOLN
0.5000 mg | RESPIRATORY_TRACT | Status: DC
  Administered 2012-07-21 (×2): 0.5 mg via RESPIRATORY_TRACT
  Filled 2012-07-21 (×2): qty 2.5

## 2012-07-21 MED ORDER — FLUTICASONE PROPIONATE HFA 44 MCG/ACT IN AERO: 1.0000 | INHALATION_SPRAY | Freq: Two times a day (BID) | RESPIRATORY_TRACT | Status: DC

## 2012-07-21 MED ORDER — ALBUTEROL SULFATE (5 MG/ML) 0.5% IN NEBU: 5.0000 mg | INHALATION_SOLUTION | RESPIRATORY_TRACT | Status: DC

## 2012-07-21 MED ORDER — FLUTICASONE PROPIONATE HFA 44 MCG/ACT IN AERO
1.0000 | INHALATION_SPRAY | Freq: Two times a day (BID) | RESPIRATORY_TRACT | Status: DC
  Administered 2012-07-21: 1 via RESPIRATORY_TRACT
  Filled 2012-07-21: qty 10.6

## 2012-07-21 MED ORDER — IPRATROPIUM BROMIDE 0.02 % IN SOLN: 0.5000 mg | RESPIRATORY_TRACT | Status: DC

## 2012-07-21 MED ORDER — PREDNISONE 50 MG PO TABS: ORAL_TABLET | ORAL | Status: AC

## 2012-07-21 NOTE — Progress Notes (Signed)
Family Medicine Teaching Service Daily Progress Note Intern Pager: [phone]  Patient name: [name] Medical record number: [id number] Date of birth: Apr 20, 1986 Age: 26 y.o. Gender: male  Primary Care Provider: Woodrow Dulski, Cristal Deer, MD  Subjective: Pt seen at bedside. States again he would like to go home today, but understands he may have to stay overnight again if his breathing is not improved. Otherwise states he feels okay, with only occasional/slight shortness of breath, but does still endorse wheezing.  Objective: Temp:  [98.2 F (36.8 C)-98.3 F (36.8 C)] 98.3 F (36.8 C) (07/29 0531) Pulse Rate:  [88-123] 88  (07/29 0531) Resp:  [20-26] 20  (07/29 0531) BP: (124-136)/(68-86) 136/86 mmHg (07/29 0531) SpO2:  [92 %-99 %] 99 % (07/29 0733) Weight:  [147 lb 4.3 oz (66.8 kg)] 147 lb 4.3 oz (66.8 kg) (07/28 2039) Exam: Vitals: HR in low 100's on telemetry during my exam; 88 documented above earlier this morning Gen: Pt sitting up in bed, in NAD, speaking in complete sentences without being short of breath, on room air Pulm: diffuse expiratory wheezes with protracted expiratory phase; generally clearer exam with better air movement than yesterday Cardio: slightly increased rate but no obvious murmur or gallop; distal pulses 2+ and intact/symmetric  Abd: soft, non-tender, non-distended without guarding  Ext: moves extremities equally, sits up in bed/moves without support  Laboratory:  Lab 07/20/12 0209  WBC 8.7  HGB 16.0  HCT 46.5  PLT 127*    Lab 07/20/12 0209  NA 140  K 3.7  CL 103  CO2 25  BUN 14  CREATININE 0.81  LABGLOM --  GLUCOSE 108*  CALCIUM 9.3   Imaging/Diagnostic Tests: CXR, 7/27 2@2357  IMPRESSION:  Mild peribronchial cuffing may reflect reactive airway disease or  viral infection. No focal consolidation  Assessment and Plan: [name] is a 26yo male with PMH significant for persistent asthma who presented with an acute exacerbation of his  asthma with a 1-day history of shortness of breath and wheezing, with cold-like symptoms for 2-3 days.  1. Asthma exacerbation - Hx of multiple admissions (some requiring intubation) in the past, most recently 3 years ago. This exacerbation was ikely triggered by viral infection +/- weather; pt with hx of cold-like symptoms and cough prior to admission, with nighttime cough for the last few months. Pt describes asthma as usually exercise-induced but he does notice more trouble with allergies in spring/fall; not currently taking a controller medication. Hypoxic on admission with some improvement s/p nebulizers x2 and CAT x2 hours. Pt still with some subjective shortness of breath and wheezing, exam as above. PLAN - Continue Albuterol scheduled q4, PRN q1. Fluticasone 1 puff BID (new medication), ordered to start 7/28 while admitted but documented as "med unavailable" x3 doses; order canceled and re-entered 7/29 at 0925. Prednisone 50 mg for 5 days (last dose 8/1). O2 as needed. Incentive spirometry.   2. Musculoskeletal chest pain - Most likely due to respiratory distress/increased work of breathing/tachypnea/etc. Currently no concern for cardiac source. EKG shows tachycardia but not suggestive for MI. Pt given Toradol IV q6 x3, with good relief, on 7/28. No current complaints. PLAN - Will switch to oral ibuprofen PRN.   3. Tachycardia - In the 120's-130's, 7/28 as of 0915, but pt has received multiple albuterol nebulizers and CATx2. EKG as above. Currently slowly improving, will continue to monitor.  FEN/GI: NPO for now, advance diet once breathing improves  Prophylaxis: Lovenox  Dispo: home pending improvement; hopefully today Code Status: Full  Code   Bobbye Morton, MD PGY-1, [facility] Family Medicine FPTS Intern pager: [phone]

## 2012-07-21 NOTE — Progress Notes (Signed)
07/21/2012 pneumococcal vaccine given in left deltoid  At 1033. Lot # [id number] and expire June 14, 2013.

## 2012-07-21 NOTE — Progress Notes (Signed)
FMTS Attending Daily Note:  Samuel Don MD  [phone] pager  Family Practice pager:  [phone] I have seen and examined this patient and have reviewed their chart. I have discussed this patient with the resident. I agree with the resident's findings, assessment and care plan.  26 yo M admitted for asthma exacerbation.  Still with some moderate wheezing on ausculation but has progressively been improving.  Long-term asthma control with inhaled corticosteroid will be mainstay of treatment in this young man.  Once we have procured this and he has had his final nebulizer treatment for today, will D/C home with close follow-up later this week.

## 2012-07-21 NOTE — Discharge Summary (Signed)
Discharge Summary 07/21/2012 10:09 PM  [name] DOB: [birth year] MRN: [id number]  Date of Admission: 07/19/2012 Date of Discharge: 07/21/2012  PCP: Maryjean Ka, MD Consultants: None  Reason for Admission: acute shortness of breath and wheezing  Discharge Diagnosis Persistent asthma  Follow up Issues:  - F/u respiratory symptoms - Make sure patient hooked into MAP program for flovent (pt given inhaler to go home with for 1 month, previously had been unable to afford QVAR) - Re-evaluate asthma medications for long-term control - Make sure patient knows how to use home nebulizer  Hospital Course: [name] is a 25yo male with PMH significant for persistent asthma who presented with an acute exacerbation of his asthma with a 1-day history of shortness of breath and wheezing, with cold-like symptoms for 2-3 days. Admitted on 7/27. Details as below.  1. Asthma exacerbation - Hx of multiple admissions (some requiring intubation) in the past, most recently 3 years ago. This exacerbation was ikely triggered by viral infection +/- weather; pt with hx of cold-like symptoms and cough prior to admission, with nighttime cough for the last few months. Pt describes asthma as usually exercise-induced but he does notice more trouble with allergies in spring/fall; not currently taking a controller medication. Hypoxic on admission with some improvement s/p nebulizers x2 and CAT x2 hours. On discharge day, lungs were nearly clear to auscultation with very mild inspiratory and expiratory wheeze bilateral lower lobes. Sent home with home nebulizer machine organized through [facility] for albuterol and atrovent. Also sent home with flovent inhaler to last patient a month. Pt was prescribed albuterol and atrovent q4hours and flovent 1 puff BID and instructed patient to take like this until f/u appointment on 7/31, at which time medications would be further managed. Also continued prednisone for  8 more days, for a total of 10 days treatment.  2. Musculoskeletal chest pain - Most likely due to respiratory distress/increased work of breathing/tachypnea/etc. Currently no concern for cardiac source. EKG shows tachycardia but not suggestive for MI. Pt given Toradol IV q6 x3, with good relief, on 7/28. Resolved at time of discharge; pt instructed to take ibuprofen as needed.  3. Tachycardia - In the 120's-130's, 7/28 as of 0915, but pt has received multiple albuterol nebulizers and CATx2. EKG not concerning for MI. Slowly improved throughout hospital course, last checked at 96 on day of discharge.  Procedures: none  Discharge Medications Medication List  As of 07/21/2012 10:09 PM   START taking these medications         * albuterol (5 MG/ML) 0.5% nebulizer solution   Commonly known as: PROVENTIL   Take 1 mL (5 mg total) by nebulization every 4 (four) hours.      fluticasone 44 MCG/ACT inhaler   Commonly known as: FLOVENT HFA   Inhale 1 puff into the lungs 2 (two) times daily.      ipratropium 0.02 % nebulizer solution   Commonly known as: ATROVENT   Take 2.5 mLs (0.5 mg total) by nebulization every 4 (four) hours.      ondansetron 4 MG tablet   Commonly known as: ZOFRAN   Take 1 tablet (4 mg total) by mouth every 6 (six) hours as needed for nausea.      predniSONE 50 MG tablet   Commonly known as: DELTASONE   50 mg oral, daily with breakfast     * Notice: This list has 1 medication(s) that are the same as other medications prescribed for you. Read the  directions carefully, and ask your doctor or other care provider to review them with you.       CONTINUE taking these medications         * albuterol 108 (90 BASE) MCG/ACT inhaler   Commonly known as: PROVENTIL HFA;VENTOLIN HFA   Inhale 2 puffs into the lungs every 4 (four) hours as needed. For dyspnea.     * Notice: This list has 1 medication(s) that are the same as other medications prescribed for you. Read the directions  carefully, and ask your doctor or other care provider to review them with you.        Where to get your medications    These are the prescriptions that you need to pick up. We sent them to a specific pharmacy, so you will need to go there to get them.   [facility] - [place] - [address]    [address] [place] [address]    Phone: [phone]        albuterol (5 MG/ML) 0.5% nebulizer solution   fluticasone 44 MCG/ACT inhaler   ipratropium 0.02 % nebulizer solution   ondansetron 4 MG tablet   predniSONE 50 MG tablet           Pertinent Hospital Labs:  Lab 07/20/12 0209  WBC 8.7  HGB 16.0  HCT 46.5  PLT 127*    Lab 07/20/12 0209  NA 140  K 3.7  CL 103  CO2 25  BUN 14  CREATININE 0.81  CALCIUM 9.3  PROT --  BILITOT --  ALKPHOS --  ALT --  AST --  GLUCOSE 108*   Imaging/Diagnostics: CXR, 7/27 @2357 : IMPRESSION:  Mild peribronchial cuffing may reflect reactive airway disease or  viral infection. No focal consolidation.  Discharge instructions: Pt instructed on how and when to take medications, as well as to return to the ED or call the clinic if he has worsening difficulty breathing, chest pain, etc. Pt notified of follow-up appointment, as below.  Condition at discharge: stable  Disposition: home with close follow-up  Pending Tests: none  Follow up Appointments: Follow-up Information    Follow up with [facility] on 07/23/2012. (at 2 pm - Please arrive 10 minutes prior to appointment)    Contact information:   [address] [place] [address] [phone]

## 2012-07-21 NOTE — Progress Notes (Signed)
   CARE MANAGEMENT NOTE 07/21/2012  Patient:  [name], [name]   Account Number:  [id number]  Date Initiated:  07/21/2012  Documentation initiated by:  Darlyne Russian  Subjective/Objective Assessment:   Patient admitted with asthma exacerbation     Action/Plan:   Progression of care and discharge planning   Anticipated DC Date:  07/21/2012   Anticipated DC Plan:  HOME/SELF CARE      DC Planning Services  CM consult      Choice offered to / List presented to:             Status of service:  In process, will continue to follow Medicare Important Message given?   (If response is "NO", the following Medicare IM given date fields will be blank) Date Medicare IM given:   Date Additional Medicare IM given:    Discharge Disposition:    Per UR Regulation:    If discussed at Long Length of Stay Meetings, dates discussed:    Comments:  07/21/2012  [address] RN, [place]  [phone] Per MD patient to be discharged with nebulizer.  [facility]/ [facility]: called with order for nebulizer  Discussed with patient the order for nebulizer.   07/21/2012  [address] Darlyne Russian RN, [place] [phone] Met with patient to discuss discharge planning and medications. He was not able to afford the QVAR inhaler and his MD had ordered the Flovent for him to take at discharge. CM will follow up with benefits check for Flovent.

## 2012-07-22 NOTE — Discharge Summary (Signed)
Family Medicine Teaching Service  Discharge Note : Attending Renold Don MD Pager [phone] I have seen and examined this patient, reviewed their chart and discussed discharge planning wit the resident at the time of discharge. I agree with the discharge plan as above.

## 2012-07-23 ENCOUNTER — Encounter: Payer: Self-pay | Admitting: Family Medicine

## 2012-07-23 ENCOUNTER — Ambulatory Visit (INDEPENDENT_AMBULATORY_CARE_PROVIDER_SITE_OTHER): Payer: BC Managed Care – PPO | Admitting: Family Medicine

## 2012-07-23 VITALS — BP 119/77 | HR 98 | Ht 66.0 in | Wt 144.0 lb

## 2012-07-23 DIAGNOSIS — J45909 Unspecified asthma, uncomplicated: Secondary | ICD-10-CM

## 2012-07-23 DIAGNOSIS — IMO0002 Reserved for concepts with insufficient information to code with codable children: Secondary | ICD-10-CM

## 2012-07-23 NOTE — Progress Notes (Signed)
  Subjective:    Patient ID: [name], male    DOB: 1986/01/18, 26 y.o.   MRN: [id number]  HPI  Discharged from the hospital 2 days ago for asthma exacerbation  Reports no longer dyspneic, taking prednisone and flovenet.  using albuterol twice a day.  No fever, chills. Review of Systemssee HPI     Objective:   Physical ExamGEN: Alert & Oriented, No acute distress CV:  Regular Rate & Rhythm, no murmur Respiratory:  Normal work of breathing, rare end expiratory wheeze Abd:  + BS, soft, no tenderness to palpation Ext: no pre-tibial edema         Assessment & Plan:

## 2012-07-23 NOTE — Patient Instructions (Addendum)
Finish taking full course of prednisone  Continue to use flovent every day  Use albuterol as needed  Follow-up with your primary doctor in 1 month

## 2012-07-23 NOTE — Assessment & Plan Note (Signed)
Follow-up for asthma exacerbation, doing well  Encouraged to continue course of prednisone.  He understand Flovent use twice daily for prevention.  Encouraged him to follow-up in one month with PC to ensure continues compliance and to prevent readmission.

## 2012-08-04 ENCOUNTER — Ambulatory Visit: Payer: BC Managed Care – PPO | Admitting: Family Medicine

## 2013-01-09 ENCOUNTER — Encounter: Payer: Self-pay | Admitting: Family Medicine

## 2013-01-09 ENCOUNTER — Ambulatory Visit (INDEPENDENT_AMBULATORY_CARE_PROVIDER_SITE_OTHER): Payer: Self-pay | Admitting: Family Medicine

## 2013-01-09 VITALS — BP 122/77 | HR 79 | Temp 98.9°F | Ht 66.0 in | Wt 155.4 lb

## 2013-01-09 DIAGNOSIS — J45990 Exercise induced bronchospasm: Secondary | ICD-10-CM

## 2013-01-09 DIAGNOSIS — A630 Anogenital (venereal) warts: Secondary | ICD-10-CM

## 2013-01-09 DIAGNOSIS — B977 Papillomavirus as the cause of diseases classified elsewhere: Secondary | ICD-10-CM

## 2013-01-09 DIAGNOSIS — J45909 Unspecified asthma, uncomplicated: Secondary | ICD-10-CM

## 2013-01-09 MED ORDER — ALBUTEROL SULFATE HFA 108 (90 BASE) MCG/ACT IN AERS: 2.0000 | INHALATION_SPRAY | RESPIRATORY_TRACT | Status: DC | PRN

## 2013-01-09 MED ORDER — ALBUTEROL SULFATE (5 MG/ML) 0.5% IN NEBU: 5.0000 mg | INHALATION_SOLUTION | RESPIRATORY_TRACT | Status: DC

## 2013-01-09 NOTE — Assessment & Plan Note (Addendum)
Exam consistant with HPV Check for other STDs

## 2013-01-09 NOTE — Assessment & Plan Note (Signed)
Refilled meds.  Discussed controler versus reliever meds

## 2013-01-09 NOTE — Patient Instructions (Addendum)
I refilled your asthma meds.  If you start having more frequent flairs, you will need to get back on the flovent. You will get a flu shot today. The viral illness should run its course over the next few days. Please get rechecked if you are still sick one week from now.   The bumps are most likely an HPV infection.  Please google HPV or human papilloma virus to learn more.  It would still be a good idea to get the gardisil vaccine. I will call with blood work results.   Use a hemorrhoid gel for the discomfort

## 2013-01-09 NOTE — Progress Notes (Signed)
  Subjective:    Patient ID: [name], male    DOB: [birth year], 27 y.o.   MRN: [id number]  HPI  Anal itching, pain and bumps.  Bisexual.  Last encounter ~1 month ago.  Regularly uses condoms.  Never gardisil.  No penile symptoms or lesions  Asthma well controled desires refill on meds    Review of Systems     Objective:   Physical Exam    Rectum:  Perianal small condyloma.  No ulcerative lesions suggestive of herpes. Penis normal       Assessment & Plan:

## 2013-01-10 ENCOUNTER — Encounter: Payer: Self-pay | Admitting: Family Medicine

## 2013-01-12 ENCOUNTER — Telehealth: Payer: Self-pay | Admitting: *Deleted

## 2013-01-12 NOTE — Telephone Encounter (Signed)
LVM for patient to call back. ?

## 2013-01-12 NOTE — Telephone Encounter (Signed)
Message copied by Farrell Ours on Mon Jan 12, 2013  9:06 AM ------      Message from: Tivis Ringer      Created: Sat Jan 10, 2013  1:17 PM       Please call Mr. Gilkey Monday and tell him his blood tests were fine.  Thanks.      ----- Message -----         From: [facility]         Sent: 01/09/2013   8:33 PM           To: Sanjuana Letters, MD

## 2013-01-12 NOTE — Telephone Encounter (Signed)
Message given to pt

## 2013-07-10 ENCOUNTER — Encounter (HOSPITAL_COMMUNITY): Payer: Self-pay | Admitting: *Deleted

## 2013-07-10 ENCOUNTER — Emergency Department (HOSPITAL_COMMUNITY)
Admission: EM | Admit: 2013-07-10 | Discharge: 2013-07-10 | Disposition: A | Discharge status: Home / Self Care | Payer: 59 | Attending: Emergency Medicine | Admitting: Emergency Medicine

## 2013-07-10 DIAGNOSIS — Y939 Activity, unspecified: Secondary | ICD-10-CM | POA: Insufficient documentation

## 2013-07-10 DIAGNOSIS — S058X9A Other injuries of unspecified eye and orbit, initial encounter: Secondary | ICD-10-CM | POA: Insufficient documentation

## 2013-07-10 DIAGNOSIS — S0501XA Injury of conjunctiva and corneal abrasion without foreign body, right eye, initial encounter: Secondary | ICD-10-CM

## 2013-07-10 DIAGNOSIS — I10 Essential (primary) hypertension: Secondary | ICD-10-CM | POA: Insufficient documentation

## 2013-07-10 DIAGNOSIS — X58XXXA Exposure to other specified factors, initial encounter: Secondary | ICD-10-CM | POA: Insufficient documentation

## 2013-07-10 DIAGNOSIS — Y929 Unspecified place or not applicable: Secondary | ICD-10-CM | POA: Insufficient documentation

## 2013-07-10 DIAGNOSIS — S0010XA Contusion of unspecified eyelid and periocular area, initial encounter: Secondary | ICD-10-CM | POA: Insufficient documentation

## 2013-07-10 DIAGNOSIS — J45909 Unspecified asthma, uncomplicated: Secondary | ICD-10-CM | POA: Insufficient documentation

## 2013-07-10 MED ORDER — KETOROLAC TROMETHAMINE 0.5 % OP SOLN: 1.0000 [drp] | Freq: Four times a day (QID) | OPHTHALMIC | Status: DC | PRN

## 2013-07-10 MED ORDER — FLUORESCEIN SODIUM 1 MG OP STRP
1.0000 | ORAL_STRIP | Freq: Once | OPHTHALMIC | Status: AC
  Administered 2013-07-10: 1 via OPHTHALMIC
  Filled 2013-07-10: qty 1

## 2013-07-10 NOTE — ED Provider Notes (Signed)
History    CSN: [id number] Arrival date & time 07/10/13  0249  First MD Initiated Contact with Patient 07/10/13 0258     Chief Complaint  Patient presents with  . Conjunctivitis   (Consider location/radiation/quality/duration/timing/severity/associated sxs/prior Treatment) Patient is a 27 y.o. male presenting with conjunctivitis. The history is provided by the patient.  Conjunctivitis  He noted onset 5 days ago of bilateral red eyes. He was seen by a physician 4 days ago diagnosed with pinkeye and came up prescription for gentamicin drops. Has been using the gentamicin drops without improvement. His eyes that she started getting worse 3 days ago-one day after starting the eyedrops. His left eye has improved and is not bothering him but he has pain and some slight crusting of his right eye. His eye bothers him more when he is at work. It does not bother her much when he is at home. He denies any blurred vision. He denies any sick contacts. Past Medical History  Diagnosis Date  . Allergy   . Asthma    History reviewed. No pertinent past surgical history. Family History  Problem Relation Age of Onset  . Hypertension Mother    History  Substance Use Topics  . Smoking status: Never Smoker   . Smokeless tobacco: Never Used  . Alcohol Use: No    Review of Systems  All other systems reviewed and are negative.    Allergies  Review of patient's allergies indicates no known allergies.  Home Medications   Current Outpatient Rx  Name  Route  Sig  Dispense  Refill  . albuterol (PROAIR HFA) 108 (90 BASE) MCG/ACT inhaler   Inhalation   Inhale 2 puffs into the lungs every 4 (four) hours as needed. For dyspnea.   1 Inhaler   3   . albuterol (PROVENTIL) (5 MG/ML) 0.5% nebulizer solution   Nebulization   Take 1 mL (5 mg total) by nebulization every 4 (four) hours.   20 mL   6    BP 134/77  Pulse 70  Temp(Src) 98 F (36.7 C) (Oral)  Resp 18  SpO2 97% Physical Exam   Nursing note and vitals reviewed.  27 year old male, resting comfortably and in no acute distress. Vital signs are normal. Oxygen saturation is 97%, which is normal. Head is normocephalic and atraumatic. PERRLA, EOMI. Oropharynx is clear. There is moderate bilateral conjunctival injection which is worse on the right hip. Anterior chambers clear. Slit-lamp exam shows no foreign bodies and no cells in the anterior chamber. Eyes were stained with fluorescein and examined with cobalt blue filter and there were some linear areas of uptake in the right cornea. There were no dendritic lesions. There is no palpable preauricular lymph node. Neck is nontender and supple without adenopathy or JVD. Back is nontender and there is no CVA tenderness. Lungs are clear without rales, wheezes, or rhonchi. Chest is nontender. Heart has regular rate and rhythm without murmur. Abdomen is soft, flat, nontender without masses or hepatosplenomegaly and peristalsis is normoactive. Extremities have no cyanosis or edema, full range of motion is present. Skin is warm and dry without rash. Neurologic: Mental status is normal, cranial nerves are intact, there are no motor or sensory deficits.  ED Course  Procedures (including critical care time) Labs Reviewed - No data to display No results found. 1. Injury of conjunctiva and corneal abrasion of right eye without foreign body, initial encounter     MDM  Corneal abrasion of the right eye.  Conjunctivitis which may be exacerbated by gentamicin. He is being referred to ophthomology for repeat exam to make sure that he does not have herpes keratitis. I think this is very unlikely. He is to discontinue gentamicin drops.  Dione Booze, MD 07/10/13 [phone]

## 2013-07-10 NOTE — ED Notes (Signed)
Pt states right eye has been pink since Sunday. Pt states no drainage, pt states slight crust in eye when he wakes up.

## 2013-07-10 NOTE — ED Notes (Signed)
Pt sts gentamycin eye drops he was sent home with is not working.  He was told by his doctor that he had pink eye on Monday.

## 2013-08-31 ENCOUNTER — Emergency Department (HOSPITAL_COMMUNITY): Payer: No Typology Code available for payment source

## 2013-08-31 ENCOUNTER — Encounter (HOSPITAL_COMMUNITY): Payer: Self-pay | Admitting: Emergency Medicine

## 2013-08-31 ENCOUNTER — Emergency Department (HOSPITAL_COMMUNITY)
Admission: EM | Admit: 2013-08-31 | Discharge: 2013-08-31 | Disposition: A | Discharge status: Home / Self Care | Payer: No Typology Code available for payment source | Attending: Emergency Medicine | Admitting: Emergency Medicine

## 2013-08-31 DIAGNOSIS — Z9109 Other allergy status, other than to drugs and biological substances: Secondary | ICD-10-CM | POA: Insufficient documentation

## 2013-08-31 DIAGNOSIS — Y9389 Activity, other specified: Secondary | ICD-10-CM | POA: Insufficient documentation

## 2013-08-31 DIAGNOSIS — Z79899 Other long term (current) drug therapy: Secondary | ICD-10-CM | POA: Insufficient documentation

## 2013-08-31 DIAGNOSIS — S63619A Unspecified sprain of unspecified finger, initial encounter: Secondary | ICD-10-CM

## 2013-08-31 DIAGNOSIS — J45909 Unspecified asthma, uncomplicated: Secondary | ICD-10-CM | POA: Insufficient documentation

## 2013-08-31 DIAGNOSIS — IMO0002 Reserved for concepts with insufficient information to code with codable children: Secondary | ICD-10-CM | POA: Insufficient documentation

## 2013-08-31 DIAGNOSIS — Y92410 Unspecified street and highway as the place of occurrence of the external cause: Secondary | ICD-10-CM | POA: Insufficient documentation

## 2013-08-31 DIAGNOSIS — S63639A Sprain of interphalangeal joint of unspecified finger, initial encounter: Secondary | ICD-10-CM | POA: Insufficient documentation

## 2013-08-31 MED ORDER — IBUPROFEN 400 MG PO TABS
800.0000 mg | ORAL_TABLET | Freq: Once | ORAL | Status: AC
  Administered 2013-08-31: 800 mg via ORAL
  Filled 2013-08-31: qty 2
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
## 2013-09-01 NOTE — ED Provider Notes (Signed)
Medical screening examination/treatment/procedure(s) were performed by non-physician practitioner and as supervising physician I was immediately available for consultation/collaboration.  Darlys Gales, MD 09/01/13 [phone]

## 2013-12-13 ENCOUNTER — Emergency Department (HOSPITAL_COMMUNITY)
Admission: EM | Admit: 2013-12-13 | Discharge: 2013-12-14 | Disposition: A | Discharge status: Home / Self Care | Payer: 59 | Attending: Emergency Medicine | Admitting: Emergency Medicine

## 2013-12-13 ENCOUNTER — Encounter (HOSPITAL_COMMUNITY): Payer: Self-pay | Admitting: Emergency Medicine

## 2013-12-13 DIAGNOSIS — Z79899 Other long term (current) drug therapy: Secondary | ICD-10-CM | POA: Insufficient documentation

## 2013-12-13 DIAGNOSIS — L0231 Cutaneous abscess of buttock: Secondary | ICD-10-CM | POA: Insufficient documentation

## 2013-12-13 DIAGNOSIS — J45909 Unspecified asthma, uncomplicated: Secondary | ICD-10-CM | POA: Insufficient documentation

## 2013-12-13 NOTE — ED Notes (Signed)
The pt has had an abscess on his buttocks for the past 3 weeks

## 2013-12-14 MED ORDER — HYDROCODONE-ACETAMINOPHEN 5-325 MG PO TABS: 1.0000 | ORAL_TABLET | ORAL | Status: DC | PRN

## 2013-12-14 MED ORDER — CEPHALEXIN 500 MG PO CAPS: 500.0000 mg | ORAL_CAPSULE | Freq: Four times a day (QID) | ORAL | Status: DC

## 2013-12-14 MED ORDER — SULFAMETHOXAZOLE-TRIMETHOPRIM 800-160 MG PO TABS: 1.0000 | ORAL_TABLET | Freq: Two times a day (BID) | ORAL | Status: AC

## 2013-12-14 NOTE — ED Provider Notes (Signed)
Medical screening examination/treatment/procedure(s) were performed by non-physician practitioner and as supervising physician I was immediately available for consultation/collaboration.  EKG Interpretation   None         Brandt Loosen, MD 12/14/13 [phone]

## 2013-12-14 NOTE — ED Provider Notes (Signed)
CSN: [id number]     Arrival date & time 12/13/13  2137 History   First MD Initiated Contact with Patient 12/14/13 0004     Chief Complaint  Patient presents with  . Abscess   (Consider location/radiation/quality/duration/timing/severity/associated sxs/prior Treatment) HPI History provided by pt.   Pt presents w/ severely painful abscess of right buttock for the past week.  Has applied warm compresses but no drainage.  No associated fever, other skin changes, change in bowels.  Has not taken anything for pain.  Has had multiple in the past as well.  Past Medical History  Diagnosis Date  . Allergy   . Asthma    History reviewed. No pertinent past surgical history. Family History  Problem Relation Age of Onset  . Hypertension Mother    History  Substance Use Topics  . Smoking status: Never Smoker   . Smokeless tobacco: Never Used  . Alcohol Use: No    Review of Systems  All other systems reviewed and are negative.    Allergies  Review of patient's allergies indicates no known allergies.  Home Medications   Current Outpatient Rx  Name  Route  Sig  Dispense  Refill  . albuterol (PROAIR HFA) 108 (90 BASE) MCG/ACT inhaler   Inhalation   Inhale 2 puffs into the lungs every 4 (four) hours as needed. For dyspnea.   1 Inhaler   3   . albuterol (PROVENTIL) (5 MG/ML) 0.5% nebulizer solution   Nebulization   Take 1 mL (5 mg total) by nebulization every 4 (four) hours.   20 mL   6   . Tetrahydrozoline HCl (EYE DROPS OP)   Both Eyes   Place 2 drops into both eyes as needed (dry eyes). "rohto"         . cephALEXin (KEFLEX) 500 MG capsule   Oral   Take 1 capsule (500 mg total) by mouth 4 (four) times daily.   28 capsule   0   . HYDROcodone-acetaminophen (NORCO/VICODIN) 5-325 MG per tablet   Oral   Take 1 tablet by mouth every 4 (four) hours as needed for moderate pain.   20 tablet   0   . sulfamethoxazole-trimethoprim (BACTRIM DS,SEPTRA DS) 800-160 MG per tablet  Oral   Take 1 tablet by mouth 2 (two) times daily.   14 tablet   0    BP 123/68  Pulse 97  Temp(Src) 98.3 F (36.8 C) (Oral)  Resp 18  SpO2 96% Physical Exam  Nursing note and vitals reviewed. Constitutional: He is oriented to person, place, and time. He appears well-developed and well-nourished. No distress.  HENT:  Head: Normocephalic and atraumatic.  Eyes:  Normal appearance  Neck: Normal range of motion.  Pulmonary/Chest: Effort normal.  Genitourinary:  No tenderness of rectum  Musculoskeletal: Normal range of motion.  Neurological: He is alert and oriented to person, place, and time.  Skin:  3cm fluctuant, severely tender abscess of right low buttock.  Right inguinal lymphadenopathy  Psychiatric: He has a normal mood and affect. His behavior is normal.    ED Course  Procedures (including critical care time) INCISION AND DRAINAGE Performed by: Ruby Cola E Consent: Verbal consent obtained. Risks and benefits: risks, benefits and alternatives were discussed Type: abscess  Body area: R buttock  Anesthesia: local infiltration  Incision was made with a scalpel.  Local anesthetic: lidocaine 2% w/ epinephrine  Anesthetic total: 3 ml  Complexity: complex Blunt dissection to break up loculations  Drainage: purulent  Drainage  amount: large  Packing material: none  Patient tolerance: Patient tolerated the procedure well with no immediate complications.    Labs Review Labs Reviewed - No data to display Imaging Review No results found.  EKG Interpretation   None       MDM   1. Abscess of left buttock    27yo M presents w/ abcsess of right buttock.  Low suspicion for rectal involvement.  Inguinal lymphadenopathy w/out other GU sx and was recently tested for STDs.  Abscess I&D'd.  D/c'd home w/ vicodin and bactrim/keflex.  Return precautions including persistent adenopathy discussed.     Otilio Miu, PA-C 12/14/13 0110

## 2014-06-11 ENCOUNTER — Emergency Department (HOSPITAL_COMMUNITY)
Admission: EM | Admit: 2014-06-11 | Discharge: 2014-06-11 | Disposition: A | Discharge status: Home / Self Care | Payer: Self-pay | Attending: Emergency Medicine | Admitting: Emergency Medicine

## 2014-06-11 ENCOUNTER — Emergency Department (HOSPITAL_COMMUNITY): Payer: 59

## 2014-06-11 ENCOUNTER — Encounter (HOSPITAL_COMMUNITY): Payer: Self-pay | Admitting: Emergency Medicine

## 2014-06-11 DIAGNOSIS — K625 Hemorrhage of anus and rectum: Secondary | ICD-10-CM | POA: Insufficient documentation

## 2014-06-11 DIAGNOSIS — J45909 Unspecified asthma, uncomplicated: Secondary | ICD-10-CM | POA: Insufficient documentation

## 2014-06-11 DIAGNOSIS — Z792 Long term (current) use of antibiotics: Secondary | ICD-10-CM | POA: Insufficient documentation

## 2014-06-11 LAB — CBC WITH DIFFERENTIAL/PLATELET
BASOS ABS: 0 10*3/uL (ref 0.0–0.1)
Basophils Relative: 0 % (ref 0–1)
EOS PCT: 8 % — AB (ref 0–5)
Eosinophils Absolute: 0.5 10*3/uL (ref 0.0–0.7)
HCT: 45.8 % (ref 39.0–52.0)
Hemoglobin: 15.3 g/dL (ref 13.0–17.0)
LYMPHS PCT: 39 % (ref 12–46)
Lymphs Abs: 2.8 10*3/uL (ref 0.7–4.0)
MCH: 26.9 pg (ref 26.0–34.0)
MCHC: 33.4 g/dL (ref 30.0–36.0)
MCV: 80.6 fL (ref 78.0–100.0)
Monocytes Absolute: 1.2 10*3/uL — ABNORMAL HIGH (ref 0.1–1.0)
Monocytes Relative: 18 % — ABNORMAL HIGH (ref 3–12)
Neutro Abs: 2.4 10*3/uL (ref 1.7–7.7)
Neutrophils Relative %: 35 % — ABNORMAL LOW (ref 43–77)
PLATELETS: 148 10*3/uL — AB (ref 150–400)
RBC: 5.68 MIL/uL (ref 4.22–5.81)
RDW: 14.2 % (ref 11.5–15.5)
WBC: 6.9 10*3/uL (ref 4.0–10.5)

## 2014-06-11 LAB — COMPREHENSIVE METABOLIC PANEL
ALT: 19 U/L (ref 0–53)
AST: 18 U/L (ref 0–37)
Albumin: 3.8 g/dL (ref 3.5–5.2)
Alkaline Phosphatase: 52 U/L (ref 39–117)
BUN: 19 mg/dL (ref 6–23)
CALCIUM: 8.9 mg/dL (ref 8.4–10.5)
CO2: 25 meq/L (ref 19–32)
Chloride: 101 mEq/L (ref 96–112)
Creatinine, Ser: 1.04 mg/dL (ref 0.50–1.35)
GFR calc Af Amer: >90 mL/min (ref >90)
Glucose, Bld: 94 mg/dL (ref 70–99)
Potassium: 4.2 mEq/L (ref 3.7–5.3)
SODIUM: 139 meq/L (ref 137–147)
TOTAL PROTEIN: 7.7 g/dL (ref 6.0–8.3)
Total Bilirubin: 0.7 mg/dL (ref 0.3–1.2)

## 2014-06-11 LAB — POC OCCULT BLOOD, ED: Fecal Occult Bld: POSITIVE — AB

## 2014-06-11 MED ORDER — ONDANSETRON 4 MG PO TBDP: ORAL_TABLET | ORAL | Status: DC

## 2014-06-11 MED ORDER — IOHEXOL 300 MG/ML  SOLN
100.0000 mL | Freq: Once | INTRAMUSCULAR | Status: AC | PRN
  Administered 2014-06-11: 100 mL via INTRAVENOUS

## 2014-06-11 MED ORDER — MORPHINE SULFATE 4 MG/ML IJ SOLN
6.0000 mg | Freq: Once | INTRAMUSCULAR | Status: AC
  Administered 2014-06-11: 6 mg via INTRAVENOUS
  Filled 2014-06-11: qty 2

## 2014-06-11 MED ORDER — SODIUM CHLORIDE 0.9 % IV BOLUS (SEPSIS)
1000.0000 mL | Freq: Once | INTRAVENOUS | Status: AC
  Administered 2014-06-11: 1000 mL via INTRAVENOUS

## 2014-06-11 MED ORDER — OXYCODONE HCL 5 MG PO TABS: 5.0000 mg | ORAL_TABLET | Freq: Four times a day (QID) | ORAL | Status: DC | PRN

## 2014-06-11 MED ORDER — HYDROCORTISONE ACETATE 25 MG RE SUPP
25.0000 mg | Freq: Once | RECTAL | Status: AC
  Administered 2014-06-11: 25 mg via RECTAL
  Filled 2014-06-11: qty 1

## 2014-06-11 NOTE — ED Notes (Signed)
Pt in c/o dark red blood noted in his stool over the last two days, reports pain with bowel movement and pain when cleaning area, denies abd pain unless he is using the bathroom, reports his stool has been diarrhea

## 2014-06-11 NOTE — ED Provider Notes (Signed)
CSN: [id number]     Arrival date & time 06/11/14  1636 History   First MD Initiated Contact with Patient 06/11/14 1844     Chief Complaint  Patient presents with  . GI Bleeding     (Consider location/radiation/quality/duration/timing/severity/associated sxs/prior Treatment) Patient is a 28 y.o. male presenting with hematochezia.  Rectal Bleeding Quality:  Bright red Amount:  Scant Duration:  3 days Timing:  Constant Chronicity:  New Context: anal fissures and rectal pain   Context: not anal penetration, not hemorrhoids and not rectal injury   Pain details:    Quality:  Tingling and sharp   Severity:  Mild   Duration:  3 days   Timing:  Worse with defecation Similar prior episodes: no   Relieved by:  None tried Worsened by:  Nothing tried Ineffective treatments:  None tried Associated symptoms: no abdominal pain, no dizziness, no fever, no hematemesis, no loss of consciousness and no vomiting   Risk factors: no hx of colorectal cancer, no hx of colorectal surgery and no NSAID use     Past Medical History  Diagnosis Date  . Allergy   . Asthma    History reviewed. No pertinent past surgical history. Family History  Problem Relation Age of Onset  . Hypertension Mother    History  Substance Use Topics  . Smoking status: Never Smoker   . Smokeless tobacco: Never Used  . Alcohol Use: No    Review of Systems  Constitutional: Negative for fever and chills.  HENT: Negative for congestion and rhinorrhea.   Eyes: Negative for pain.  Respiratory: Negative for cough and shortness of breath.   Cardiovascular: Negative for chest pain and palpitations.  Gastrointestinal: Positive for diarrhea, blood in stool and hematochezia. Negative for vomiting, abdominal pain, constipation, anal bleeding and hematemesis.  Endocrine: Negative for polydipsia and polyuria.  Genitourinary: Negative for dysuria and flank pain.  Musculoskeletal: Negative for back pain and neck pain.  Skin:  Negative for color change and wound.  Neurological: Negative for dizziness, loss of consciousness, numbness and headaches.      Allergies  Review of patient's allergies indicates no known allergies.  Home Medications   Prior to Admission medications   Medication Sig Start Date End Date Taking? Authorizing Provider  albuterol (PROAIR HFA) 108 (90 BASE) MCG/ACT inhaler Inhale 2 puffs into the lungs every 4 (four) hours as needed. For dyspnea. 01/09/13   Sanjuana LettersWilliam Arthur Hensel, MD  albuterol (PROVENTIL) (5 MG/ML) 0.5% nebulizer solution Take 1 mL (5 mg total) by nebulization every 4 (four) hours. 01/09/13 01/09/14  Sanjuana LettersWilliam Arthur Hensel, MD  cephALEXin (KEFLEX) 500 MG capsule Take 1 capsule (500 mg total) by mouth 4 (four) times daily. 12/14/13   Arie Sabinaatherine E Schinlever, PA-C  HYDROcodone-acetaminophen (NORCO/VICODIN) 5-325 MG per tablet Take 1 tablet by mouth every 4 (four) hours as needed for moderate pain. 12/14/13   Arie Sabinaatherine E Schinlever, PA-C  Tetrahydrozoline HCl (EYE DROPS OP) Place 2 drops into both eyes as needed (dry eyes). "rohtoData processing manager"    Historical Provider, MD   BP 133/76  Pulse 91  Temp(Src) 98.5 F (36.9 C) (Oral)  Resp 18  Wt 159 lb 3 oz (72.207 kg)  SpO2 98% Physical Exam  Nursing note and vitals reviewed. Constitutional: He is oriented to person, place, and time. He appears well-developed and well-nourished.  HENT:  Head: Normocephalic and atraumatic.  Eyes: Conjunctivae and EOM are normal. Pupils are equal, round, and reactive to light.  Neck: Normal range of motion.  Cardiovascular: Normal rate and regular rhythm.   Pulmonary/Chest: Effort normal and breath sounds normal.  Abdominal: Soft. He exhibits no distension. There is no tenderness.  Genitourinary: Rectal exam shows fissure. Rectal exam shows anal tone normal. Guaiac positive stool.  Musculoskeletal: Normal range of motion. He exhibits no edema and no tenderness.  Neurological: He is alert and oriented to person,  place, and time.  Skin: Skin is warm and dry.    ED Course  Procedures (including critical care time) Labs Review Labs Reviewed - No data to display  Imaging Review No results found.   EKG Interpretation None      MDM   Final diagnoses:  None    Pertinent positives and negatives from above HPI, ROS and PE include: 28 yo M w/ 3 days of scant rectal bleeding only with stools. Also with mild abdominal pain relieved with defecation which has been diarrhea approx 10-12 times a day. No fevers, syncope, weight loss, sob, cp or other symptoms of infectious enteritis or acute blood loss anemia. Fissure on exam but not large. Ct without evidence of IBD, just evidence of non specific enteritis. Patient symptoms improved in ED. Stable for d/c and fu w/ PCP.   Discharge instructions, including strict return precautions for new or worsening symptoms, given. Patient and/or family verbalized understanding and agreement with the plan as described.   Labs, studies and imaging reviewed by myself and considered in medical decision making if ordered. Imaging interpreted by radiology. Pt was discussed with my attending, Dr. Deretha EmoryZackowski.  Discharge Medication List as of 06/11/2014 10:19 PM    START taking these medications   Details  ondansetron (ZOFRAN ODT) 4 MG disintegrating tablet 4mg  ODT q4 hours prn nausea/vomit, Print    oxyCODONE (ROXICODONE) 5 MG immediate release tablet Take 1 tablet (5 mg total) by mouth every 6 (six) hours as needed for severe pain., Starting 06/11/2014, Until Discontinued, Print        Follow-up Information   Follow up with Street, Cristal Deerhristopher, MD. Schedule an appointment as soon as possible for a visit in 3 days.   Specialty:  Family Medicine   Contact information:   [address] [place] [address] [phone]          Marily MemosJason Mesner, MD 06/12/14 [id number]

## 2014-06-11 NOTE — ED Notes (Signed)
Pt belongings with him, pt refuses to go out on wheel chair, pt walk out with RN

## 2014-06-26 NOTE — ED Provider Notes (Signed)
I saw and evaluated the patient, reviewed the resident's note and I agree with the findings and plan.   EKG Interpretation None      Results for orders placed during the hospital encounter of 06/11/14  CBC WITH DIFFERENTIAL      Result Value Ref Range   WBC 6.9  4.0 - 10.5 K/uL   RBC 5.68  4.22 - 5.81 MIL/uL   Hemoglobin 15.3  13.0 - 17.0 g/dL   HCT 11.945.8  14.739.0 - 82.952.0 %   MCV 80.6  78.0 - 100.0 fL   MCH 26.9  26.0 - 34.0 pg   MCHC 33.4  30.0 - 36.0 g/dL   RDW 56.214.2  13.011.5 - 86.515.5 %   Platelets 148 (*) 150 - 400 K/uL   Neutrophils Relative % 35 (*) 43 - 77 %   Neutro Abs 2.4  1.7 - 7.7 K/uL   Lymphocytes Relative 39  12 - 46 %   Lymphs Abs 2.8  0.7 - 4.0 K/uL   Monocytes Relative 18 (*) 3 - 12 %   Monocytes Absolute 1.2 (*) 0.1 - 1.0 K/uL   Eosinophils Relative 8 (*) 0 - 5 %   Eosinophils Absolute 0.5  0.0 - 0.7 K/uL   Basophils Relative 0  0 - 1 %   Basophils Absolute 0.0  0.0 - 0.1 K/uL  COMPREHENSIVE METABOLIC PANEL      Result Value Ref Range   Sodium 139  137 - 147 mEq/L   Potassium 4.2  3.7 - 5.3 mEq/L   Chloride 101  96 - 112 mEq/L   CO2 25  19 - 32 mEq/L   Glucose, Bld 94  70 - 99 mg/dL   BUN 19  6 - 23 mg/dL   Creatinine, Ser 7.841.04  0.50 - 1.35 mg/dL   Calcium 8.9  8.4 - 69.610.5 mg/dL   Total Protein 7.7  6.0 - 8.3 g/dL   Albumin 3.8  3.5 - 5.2 g/dL   AST 18  0 - 37 U/L   ALT 19  0 - 53 U/L   Alkaline Phosphatase 52  39 - 117 U/L   Total Bilirubin 0.7  0.3 - 1.2 mg/dL   GFR calc non Af Amer >90  >90 mL/min   GFR calc Af Amer >90  >90 mL/min  POC OCCULT BLOOD, ED      Result Value Ref Range   Fecal Occult Bld POSITIVE (*) NEGATIVE   Ct Abdomen Pelvis W Contrast  06/11/2014   CLINICAL DATA:  28 year old male with abdominal pelvic pain, fever and blood in stools.  EXAM: CT ABDOMEN AND PELVIS WITH CONTRAST  TECHNIQUE: Multidetector CT imaging of the abdomen and pelvis was performed using the standard protocol following bolus administration of intravenous contrast.   CONTRAST:  100mL OMNIPAQUE IOHEXOL 300 MG/ML  SOLN  COMPARISON:  None.  FINDINGS: The liver, gallbladder, spleen, kidneys, pancreas and adrenal glands are unremarkable.  There is no evidence of free fluid, enlarged lymph nodes, biliary dilation or abdominal aortic aneurysm.  There is fluid within nondistended small bowel loops and colon which may reflect an enteritis.  There is no evidence of bowel obstruction, pneumoperitoneum or abscess.  What appears to the appendix is unremarkable.  No acute or suspicious bony abnormalities are identified.  IMPRESSION: Fluid within nondistended small bowel loops and colon which may reflect an enteritis.  No other acute or significant abnormalities identified.   Electronically Signed   By: Dolores FrameJeff  Hu M.D.  On: 06/11/2014 20:52    Patient with complaint of rectal bleeding for the past 3 days. Worse with bowel movements. Workup showed hemoglobin hematocrit without significant evidence of anemia. CT scan showed no evidence of other acute intra-abdominal pathology. No evidence of inflammatory bowel process. Patient's rectal exam as per the resident showed an anal fissure most likely the cause of the bleeding. And the rectal pain. Patient was seen by me. Abdomen was nontender.  Vanetta MuldersScott Zackowski, MD 06/26/14 [phone]

## 2015-04-22 ENCOUNTER — Emergency Department (HOSPITAL_COMMUNITY)
Admission: EM | Admit: 2015-04-22 | Discharge: 2015-04-22 | Disposition: A | Discharge status: Home / Self Care | Payer: 59 | Attending: Emergency Medicine | Admitting: Emergency Medicine

## 2015-04-22 ENCOUNTER — Telehealth: Payer: Self-pay | Admitting: Professional Counselor

## 2015-04-22 ENCOUNTER — Encounter (HOSPITAL_COMMUNITY): Payer: Self-pay | Admitting: *Deleted

## 2015-04-22 ENCOUNTER — Telehealth: Payer: Self-pay | Admitting: *Deleted

## 2015-04-22 DIAGNOSIS — J45909 Unspecified asthma, uncomplicated: Secondary | ICD-10-CM | POA: Diagnosis present

## 2015-04-22 DIAGNOSIS — Z79899 Other long term (current) drug therapy: Secondary | ICD-10-CM | POA: Diagnosis not present

## 2015-04-22 DIAGNOSIS — J45901 Unspecified asthma with (acute) exacerbation: Secondary | ICD-10-CM | POA: Insufficient documentation

## 2015-04-22 MED ORDER — ALBUTEROL SULFATE HFA 108 (90 BASE) MCG/ACT IN AERS: 1.0000 | INHALATION_SPRAY | Freq: Four times a day (QID) | RESPIRATORY_TRACT | Status: DC | PRN

## 2015-04-22 MED ORDER — IPRATROPIUM-ALBUTEROL 0.5-2.5 (3) MG/3ML IN SOLN
3.0000 mL | Freq: Once | RESPIRATORY_TRACT | Status: AC
  Administered 2015-04-22: 3 mL via RESPIRATORY_TRACT
  Filled 2015-04-22: qty 3

## 2015-04-22 NOTE — ED Notes (Signed)
Pt in c/o increased asthma symptoms over the last week, states he is out of his inhaler, had two asthma attacks today, denies pain, no distress noted

## 2015-04-22 NOTE — ED Provider Notes (Signed)
CSN: [id number]     Arrival date & time 04/22/15  [id number] History   First MD Initiated Contact with Patient 04/22/15 [phone]     Chief Complaint  Patient presents with  . Asthma     (Consider location/radiation/quality/duration/timing/severity/associated sxs/prior Treatment) Patient is a 29 y.o. male presenting with asthma. The history is provided by the patient. No language interpreter was used.  Asthma This is a recurrent problem. The current episode started in the past 7 days. The problem occurs constantly. The problem has been unchanged. Pertinent negatives include no abdominal pain, arthralgias, chest pain, chills, congestion, coughing, fatigue, headaches, neck pain, numbness, rash, urinary symptoms, vertigo or vomiting. Nothing aggravates the symptoms. He has tried nothing for the symptoms. The treatment provided no relief.    Past Medical History  Diagnosis Date  . Allergy   . Asthma    History reviewed. No pertinent past surgical history. Family History  Problem Relation Age of Onset  . Hypertension Mother    History  Substance Use Topics  . Smoking status: Never Smoker   . Smokeless tobacco: Never Used  . Alcohol Use: No    Review of Systems  Constitutional: Negative for chills and fatigue.  HENT: Negative for congestion.   Respiratory: Positive for wheezing. Negative for cough.   Cardiovascular: Negative for chest pain.  Gastrointestinal: Negative for vomiting and abdominal pain.  Musculoskeletal: Negative for arthralgias and neck pain.  Skin: Negative for rash.  Neurological: Negative for vertigo, numbness and headaches.  All other systems reviewed and are negative.     Allergies  Review of patient's allergies indicates no known allergies.  Home Medications   Prior to Admission medications   Medication Sig Start Date End Date Taking? Authorizing Provider  albuterol (PROAIR HFA) 108 (90 BASE) MCG/ACT inhaler Inhale 2 puffs into the lungs every 4 (four) hours  as needed. For dyspnea. 01/09/13   Moses MannersWilliam A Hensel, MD  albuterol (PROVENTIL) (5 MG/ML) 0.5% nebulizer solution Take 1 mL (5 mg total) by nebulization every 4 (four) hours. 01/09/13 06/11/14  Moses MannersWilliam A Hensel, MD  ondansetron (ZOFRAN ODT) 4 MG disintegrating tablet 4mg  ODT q4 hours prn nausea/vomit 06/11/14   Marily MemosJason Mesner, MD  oxyCODONE (ROXICODONE) 5 MG immediate release tablet Take 1 tablet (5 mg total) by mouth every 6 (six) hours as needed for severe pain. 06/11/14   Barbara CowerJason Mesner, MD   BP 115/68 mmHg  Pulse 75  Temp(Src) 97.6 F (36.4 C) (Oral)  Resp 20  SpO2 99% Physical Exam  Constitutional: He is oriented to person, place, and time. He appears well-developed and well-nourished. No distress.  HENT:  Head: Normocephalic and atraumatic.  Eyes: Conjunctivae and EOM are normal.  Neck: Normal range of motion.  Cardiovascular: Normal rate and regular rhythm.  Exam reveals no gallop and no friction rub.   No murmur heard. Pulmonary/Chest: Effort normal and breath sounds normal. He has no wheezes. He has no rales. He exhibits no tenderness.  Abdominal: Soft. He exhibits no distension. There is no tenderness. There is no rebound.  Musculoskeletal: Normal range of motion.  Neurological: He is alert and oriented to person, place, and time. Coordination normal.  Speech is goal-oriented. Moves limbs without ataxia.   Skin: Skin is warm and dry.  Psychiatric: He has a normal mood and affect. His behavior is normal.  Nursing note and vitals reviewed.   ED Course  Procedures (including critical care time) Labs Review Labs Reviewed - No data to display  Imaging Review  No results found.   EKG Interpretation None      MDM   Final diagnoses:  Asthma attack    3:47 AM Patient's breathing improved after albuterol nebulizer. Patient will have refill of albuterol inhaler. Vitals stable and patient afebrile.    [address] [place], PA-C 04/22/15 0349  Loren Racer, MD 04/22/15  [phone]

## 2015-04-22 NOTE — Telephone Encounter (Signed)
Patient reports he was discharged from the hospital early this morning, discussed with MD and RN that he does not have insurance at this time. Chart reflect [employer], however this is not active. [facility]. [facility] does not carry the inhaler on the $4 list, thus he was unable to get it filled.  LCSW followed up with CM: [name] to see if there is another medication or assistance to provide patient.

## 2015-04-22 NOTE — Telephone Encounter (Signed)
Chart reviewed.  Spoke with pt concerning his medications and not being able to afford them.  Explained the MATCH- Medication Assistance program to pt. Pt understands that the MATCH program is only a one time in one year from the date of discharge to next year. Pt also understands that there is a $3.00 co pay for each prescription.  

## 2015-04-26 ENCOUNTER — Emergency Department (HOSPITAL_COMMUNITY): Payer: 59

## 2015-04-26 ENCOUNTER — Inpatient Hospital Stay (HOSPITAL_COMMUNITY)
Admission: EM | Admit: 2015-04-26 | Discharge: 2015-04-27 | DRG: 203 | Disposition: A | Discharge status: Home / Self Care | Payer: 59 | Attending: Family Medicine | Admitting: Family Medicine

## 2015-04-26 ENCOUNTER — Encounter (HOSPITAL_COMMUNITY): Payer: Self-pay | Admitting: Nurse Practitioner

## 2015-04-26 ENCOUNTER — Encounter (HOSPITAL_COMMUNITY): Payer: Self-pay | Admitting: Emergency Medicine

## 2015-04-26 ENCOUNTER — Emergency Department (HOSPITAL_COMMUNITY)
Admission: EM | Admit: 2015-04-26 | Discharge: 2015-04-26 | Disposition: A | Discharge status: Home / Self Care | Payer: 59 | Attending: Emergency Medicine | Admitting: Emergency Medicine

## 2015-04-26 DIAGNOSIS — Z79899 Other long term (current) drug therapy: Secondary | ICD-10-CM | POA: Insufficient documentation

## 2015-04-26 DIAGNOSIS — J45901 Unspecified asthma with (acute) exacerbation: Principal | ICD-10-CM | POA: Diagnosis present

## 2015-04-26 DIAGNOSIS — J45909 Unspecified asthma, uncomplicated: Secondary | ICD-10-CM | POA: Diagnosis present

## 2015-04-26 DIAGNOSIS — J4541 Moderate persistent asthma with (acute) exacerbation: Secondary | ICD-10-CM

## 2015-04-26 LAB — CBC WITH DIFFERENTIAL/PLATELET
BASOS ABS: 0 10*3/uL (ref 0.0–0.1)
Basophils Relative: 0 % (ref 0–1)
EOS PCT: 7 % — AB (ref 0–5)
Eosinophils Absolute: 0.4 10*3/uL (ref 0.0–0.7)
HCT: 47.4 % (ref 39.0–52.0)
Hemoglobin: 16 g/dL (ref 13.0–17.0)
Lymphocytes Relative: 27 % (ref 12–46)
Lymphs Abs: 1.6 10*3/uL (ref 0.7–4.0)
MCH: 27.4 pg (ref 26.0–34.0)
MCHC: 33.8 g/dL (ref 30.0–36.0)
MCV: 81.2 fL (ref 78.0–100.0)
MONO ABS: 0.5 10*3/uL (ref 0.1–1.0)
Monocytes Relative: 8 % (ref 3–12)
Neutro Abs: 3.4 10*3/uL (ref 1.7–7.7)
Neutrophils Relative %: 58 % (ref 43–77)
PLATELETS: 120 10*3/uL — AB (ref 150–400)
RBC: 5.84 MIL/uL — ABNORMAL HIGH (ref 4.22–5.81)
RDW: 14.3 % (ref 11.5–15.5)
WBC: 6 10*3/uL (ref 4.0–10.5)

## 2015-04-26 LAB — I-STAT CHEM 8, ED
BUN: 8 mg/dL (ref 6–20)
Calcium, Ion: 1.14 mmol/L (ref 1.12–1.23)
Chloride: 102 mmol/L (ref 101–111)
Creatinine, Ser: 0.9 mg/dL (ref 0.61–1.24)
Glucose, Bld: 83 mg/dL (ref 70–99)
HCT: 53 % — ABNORMAL HIGH (ref 39.0–52.0)
HEMOGLOBIN: 18 g/dL — AB (ref 13.0–17.0)
Potassium: 3.9 mmol/L (ref 3.5–5.1)
SODIUM: 139 mmol/L (ref 135–145)
TCO2: 23 mmol/L (ref 0–100)

## 2015-04-26 MED ORDER — IPRATROPIUM-ALBUTEROL 0.5-2.5 (3) MG/3ML IN SOLN
3.0000 mL | Freq: Once | RESPIRATORY_TRACT | Status: DC
  Filled 2015-04-26: qty 3

## 2015-04-26 MED ORDER — ALBUTEROL (5 MG/ML) CONTINUOUS INHALATION SOLN
10.0000 mg/h | INHALATION_SOLUTION | RESPIRATORY_TRACT | Status: AC
  Administered 2015-04-26: 10 mg/h via RESPIRATORY_TRACT

## 2015-04-26 MED ORDER — PREDNISONE 20 MG PO TABS: 40.0000 mg | ORAL_TABLET | Freq: Every day | ORAL | Status: DC

## 2015-04-26 MED ORDER — ALBUTEROL SULFATE (2.5 MG/3ML) 0.083% IN NEBU
5.0000 mg | INHALATION_SOLUTION | Freq: Once | RESPIRATORY_TRACT | Status: AC
  Administered 2015-04-26: 5 mg via RESPIRATORY_TRACT
  Filled 2015-04-26: qty 6

## 2015-04-26 MED ORDER — PREDNISONE 20 MG PO TABS
60.0000 mg | ORAL_TABLET | Freq: Once | ORAL | Status: AC
  Administered 2015-04-26: 60 mg via ORAL
  Filled 2015-04-26: qty 3

## 2015-04-26 MED ORDER — IPRATROPIUM-ALBUTEROL 0.5-2.5 (3) MG/3ML IN SOLN
3.0000 mL | Freq: Once | RESPIRATORY_TRACT | Status: AC
  Administered 2015-04-26: 3 mL via RESPIRATORY_TRACT
  Filled 2015-04-26: qty 3

## 2015-04-26 MED ORDER — ALBUTEROL SULFATE HFA 108 (90 BASE) MCG/ACT IN AERS
2.0000 | INHALATION_SPRAY | RESPIRATORY_TRACT | Status: DC | PRN
  Administered 2015-04-26: 2 via RESPIRATORY_TRACT
  Filled 2015-04-26: qty 6.7

## 2015-04-26 MED ORDER — MAGNESIUM SULFATE 50 % IJ SOLN: 2.0000 g | Freq: Once | INTRAMUSCULAR | Status: DC

## 2015-04-26 MED ORDER — MAGNESIUM SULFATE 2 GM/50ML IV SOLN
2.0000 g | Freq: Once | INTRAVENOUS | Status: AC
  Administered 2015-04-26: 2 g via INTRAVENOUS
  Filled 2015-04-26: qty 50

## 2015-04-26 MED ORDER — SODIUM CHLORIDE 0.9 % IV BOLUS (SEPSIS)
1000.0000 mL | Freq: Once | INTRAVENOUS | Status: AC
  Administered 2015-04-26: 1000 mL via INTRAVENOUS

## 2015-04-26 MED ORDER — IPRATROPIUM BROMIDE 0.02 % IN SOLN
0.5000 mg | Freq: Once | RESPIRATORY_TRACT | Status: AC
  Administered 2015-04-26: 0.5 mg via RESPIRATORY_TRACT
  Filled 2015-04-26: qty 2.5

## 2015-04-26 MED ORDER — ALBUTEROL SULFATE HFA 108 (90 BASE) MCG/ACT IN AERS: 2.0000 | INHALATION_SPRAY | RESPIRATORY_TRACT | Status: DC | PRN

## 2015-04-26 MED ORDER — ALBUTEROL (5 MG/ML) CONTINUOUS INHALATION SOLN
20.0000 mg/h | INHALATION_SOLUTION | Freq: Once | RESPIRATORY_TRACT | Status: AC
  Administered 2015-04-26: 20 mg/h via RESPIRATORY_TRACT
  Filled 2015-04-26: qty 20

## 2015-04-26 NOTE — ED Notes (Signed)
Pt ambulated well in hall. Pt O2 was 96% while ambulating.

## 2015-04-26 NOTE — ED Notes (Signed)
Pt states he was seen in ED earlier today for asthma and treated. Pt reports 30 minutes after he got home he began having breathing difficulty again.

## 2015-04-26 NOTE — ED Provider Notes (Signed)
CSN: [id number]     Arrival date & time 04/26/15  2225 History   First MD Initiated Contact with Patient 04/26/15 2232     Chief Complaint  Patient presents with  . Asthma     (Consider location/radiation/quality/duration/timing/severity/associated sxs/prior Treatment) HPI   29 year old male with history of uncontrolled asthma presenting here for evaluation of an asthma attack. Patient report for the past week he has several asthma exacerbation and in the past 3 days his asthma exacerbation has gotten progressively worse. He attributes to a recent URI and states that this is usually what triggers his asthma attack. He has been using his inhaler every 30 minutes with minimal improvement. He was seen earlier today for this complaint. Blood work and chest x-ray was obtained without any acute finding. He received multiple breathing treatments and steroids in the ED and initially felt better however his symptoms worsen once he returned home. He denies any prior history of ICU stay or intubation for asthma exacerbation. Denies any fever, productive cough, or rash. No nausea vomiting or diarrhea. No other environmental changes.    Past Medical History  Diagnosis Date  . Allergy   . Asthma    History reviewed. No pertinent past surgical history. Family History  Problem Relation Age of Onset  . Hypertension Mother    History  Substance Use Topics  . Smoking status: Never Smoker   . Smokeless tobacco: Never Used  . Alcohol Use: No    Review of Systems  All other systems reviewed and are negative.     Allergies  Review of patient's allergies indicates no known allergies.  Home Medications   Prior to Admission medications   Medication Sig Start Date End Date Taking? Authorizing Provider  albuterol (PROVENTIL HFA;VENTOLIN HFA) 108 (90 BASE) MCG/ACT inhaler Inhale 2 puffs into the lungs every 4 (four) hours as needed for wheezing or shortness of breath. 04/26/15   Roxy Horsemanobert Browning, PA-C   ondansetron (ZOFRAN ODT) 4 MG disintegrating tablet 4mg  ODT q4 hours prn nausea/vomit 06/11/14   Marily MemosJason Mesner, MD  oxyCODONE (ROXICODONE) 5 MG immediate release tablet Take 1 tablet (5 mg total) by mouth every 6 (six) hours as needed for severe pain. 06/11/14   Marily MemosJason Mesner, MD  predniSONE (DELTASONE) 20 MG tablet Take 2 tablets (40 mg total) by mouth daily. 04/26/15   Roxy Horsemanobert Browning, PA-C   BP 160/82 mmHg  Pulse 126  Temp(Src) 97.6 F (36.4 C) (Oral)  Resp 23  SpO2 94% Physical Exam  Constitutional: He is oriented to person, place, and time. He appears well-developed and well-nourished. No distress.  Patient is sitting upright, with mild respiratory distress but nontoxic in appearance.  HENT:  Head: Atraumatic.  Right Ear: External ear normal.  Left Ear: External ear normal.  Mouth/Throat: Oropharynx is clear and moist.  Eyes: Conjunctivae are normal.  Neck: Neck supple. No tracheal deviation present. No thyromegaly present.  No nuchal rigidity  Cardiovascular:  Tachycardia no murmur rubs or gallops  Pulmonary/Chest: No stridor. He has wheezes.  Inspiratory and a story wheezes throughout. Decreased lung sounds. Able to speak in complete sentences.  Abdominal: Soft. There is no tenderness.  Musculoskeletal: He exhibits no edema.  Lymphadenopathy:    He has no cervical adenopathy.  Neurological: He is alert and oriented to person, place, and time.  Skin: No rash noted.  Psychiatric: He has a normal mood and affect.  Nursing note and vitals reviewed.   ED Course  Procedures (including critical care time)  Acute bronchospasm consistence with asthma exacerbation. Has normal chest x-ray and blood work earlier today. Plan to provide continuous nebulizing along with magnesium sulfate. Will also give IV fluid.  Was seen earlier today.  CXR and labs are unremarkable.  Care discussed with Dr. Lynelle Doctor.  12:16 AM Minimal improvement after receiving hour long continuous nebs and magnesium  sulfate. Patient placed on wheeze protocol.  I have consulted with [facility] resident at [facility] who agrees to admit pt for further management of his asthma exacerbation.  Labs Review Labs Reviewed - No data to display  Results for orders placed or performed during the hospital encounter of 04/26/15  CBC with Differential/Platelet  Result Value Ref Range   WBC 6.0 4.0 - 10.5 K/uL   RBC 5.84 (H) 4.22 - 5.81 MIL/uL   Hemoglobin 16.0 13.0 - 17.0 g/dL   HCT 40.9 81.1 - 91.4 %   MCV 81.2 78.0 - 100.0 fL   MCH 27.4 26.0 - 34.0 pg   MCHC 33.8 30.0 - 36.0 g/dL   RDW 78.2 95.6 - 21.3 %   Platelets 120 (L) 150 - 400 K/uL   Neutrophils Relative % 58 43 - 77 %   Neutro Abs 3.4 1.7 - 7.7 K/uL   Lymphocytes Relative 27 12 - 46 %   Lymphs Abs 1.6 0.7 - 4.0 K/uL   Monocytes Relative 8 3 - 12 %   Monocytes Absolute 0.5 0.1 - 1.0 K/uL   Eosinophils Relative 7 (H) 0 - 5 %   Eosinophils Absolute 0.4 0.0 - 0.7 K/uL   Basophils Relative 0 0 - 1 %   Basophils Absolute 0.0 0.0 - 0.1 K/uL  I-stat chem 8, ed  Result Value Ref Range   Sodium 139 135 - 145 mmol/L   Potassium 3.9 3.5 - 5.1 mmol/L   Chloride 102 101 - 111 mmol/L   BUN 8 6 - 20 mg/dL   Creatinine, Ser 0.86 0.61 - 1.24 mg/dL   Glucose, Bld 83 70 - 99 mg/dL   Calcium, Ion 5.78 4.69 - 1.23 mmol/L   TCO2 23 0 - 100 mmol/L   Hemoglobin 18.0 (H) 13.0 - 17.0 g/dL   HCT 62.9 (H) 52.8 - 41.3 %    Imaging Results    Dg Chest 2 View  04/26/2015 CLINICAL DATA: Shortness of breath. Asthma exacerbation. Dry cough for 3-4 days. EXAM: CHEST 2 VIEW COMPARISON: 07/19/2012 FINDINGS: Heart and mediastinal contours are within normal limits. No focal opacities or effusions. No acute bony abnormality. Mild thoracolumbar scoliosis. IMPRESSION: No active cardiopulmonary disease. Electronically Signed By: Charlett Nose M.D. On:  04/26/2015 14:03            Imaging Review Dg Chest 2 View  04/26/2015   CLINICAL DATA:  Shortness of breath. Asthma exacerbation. Dry cough for 3-4 days.  EXAM: CHEST  2 VIEW  COMPARISON:  07/19/2012  FINDINGS: Heart and mediastinal contours are within normal limits. No focal opacities or effusions. No acute bony abnormality. Mild thoracolumbar scoliosis.  IMPRESSION: No active cardiopulmonary disease.   Electronically Signed   By: Charlett Nose M.D.   On: 04/26/2015 14:03     EKG Interpretation None      MDM   Final diagnoses:  Asthma exacerbation attacks, moderate persistent    BP 130/80 mmHg  Pulse 139  Temp(Src) 97.6 F (36.4 C) (Oral)  Resp 22  SpO2 99% Tachycardia 2/2 cont albuterol nebs.      Fayrene Helper, PA-C 04/27/15 [phone]

## 2015-04-26 NOTE — Discharge Instructions (Signed)

## 2015-04-26 NOTE — ED Notes (Signed)
Pt here for asthma exacerbation endorses worsening shortness of breath over the past week sts has been using his albuterol inhaler 1-2 puff up to every 30 min. sts ran out of albuterol this morning. Patient has wheezing throughout, able to speak in full sentences. Pt also endorses mild cold with cough and congestion x 1 week.

## 2015-04-26 NOTE — ED Provider Notes (Signed)
CSN: [id number]     Arrival date & time 04/26/15  1143 History   First MD Initiated Contact with Patient 04/26/15 1308     Chief Complaint  Patient presents with  . Shortness of Breath     (Consider location/radiation/quality/duration/timing/severity/associated sxs/prior Treatment) HPI Comments: Patient presents to the emergency department with chief complaint of shortness of breath. States that he has a history of asthma. States that he has had several asthma exacerbations over the past week or so. States that it has been progressively worsening. States that he has been using his albuterol inhaler but ran out this morning. He states that he has never had an episode this bad before. He has had to be hospitalized but never intubated. He denies any fevers, chills, or productive cough. He does state that he has had some mild cold and congestion. Thinks that this is aggravating his asthma.  The history is provided by the patient. No language interpreter was used.    Past Medical History  Diagnosis Date  . Allergy   . Asthma    History reviewed. No pertinent past surgical history. Family History  Problem Relation Age of Onset  . Hypertension Mother    History  Substance Use Topics  . Smoking status: Never Smoker   . Smokeless tobacco: Never Used  . Alcohol Use: No    Review of Systems  Constitutional: Negative for fever and chills.  Respiratory: Positive for wheezing. Negative for shortness of breath.   Cardiovascular: Negative for chest pain.  Gastrointestinal: Negative for nausea, vomiting, diarrhea and constipation.  Genitourinary: Negative for dysuria.  All other systems reviewed and are negative.     Allergies  Review of patient's allergies indicates no known allergies.  Home Medications   Prior to Admission medications   Medication Sig Start Date End Date Taking? Authorizing Provider  albuterol (PROVENTIL HFA;VENTOLIN HFA) 108 (90 BASE) MCG/ACT inhaler Inhale 1-2  puffs into the lungs every 6 (six) hours as needed for wheezing or shortness of breath. 04/22/15   Emilia BeckKaitlyn Szekalski, PA-C  ondansetron (ZOFRAN ODT) 4 MG disintegrating tablet 4mg  ODT q4 hours prn nausea/vomit 06/11/14   Marily MemosJason Mesner, MD  oxyCODONE (ROXICODONE) 5 MG immediate release tablet Take 1 tablet (5 mg total) by mouth every 6 (six) hours as needed for severe pain. 06/11/14   Barbara CowerJason Mesner, MD   BP 133/78 mmHg  Pulse 98  Temp(Src) 98.3 F (36.8 C) (Oral)  Resp 19  Ht 5\' 5"  (1.651 m)  Wt 150 lb (68.04 kg)  BMI 24.96 kg/m2  SpO2 100% Physical Exam  Constitutional: He is oriented to person, place, and time. He appears well-developed and well-nourished.  HENT:  Head: Normocephalic and atraumatic.  Eyes: Conjunctivae and EOM are normal. Pupils are equal, round, and reactive to light. Right eye exhibits no discharge. Left eye exhibits no discharge. No scleral icterus.  Neck: Normal range of motion. Neck supple. No JVD present.  Cardiovascular: Normal rate, regular rhythm and normal heart sounds.  Exam reveals no gallop and no friction rub.   No murmur heard. Pulmonary/Chest: Effort normal and breath sounds normal. No respiratory distress. He has no wheezes. He has no rales. He exhibits no tenderness.  Diffuse wheezes bilaterally  Abdominal: Soft. He exhibits no distension and no mass. There is no tenderness. There is no rebound and no guarding.  Musculoskeletal: Normal range of motion. He exhibits no edema or tenderness.  Neurological: He is alert and oriented to person, place, and time.  Skin: Skin  is warm and dry.  Psychiatric: He has a normal mood and affect. His behavior is normal. Judgment and thought content normal.  Nursing note and vitals reviewed.   ED Course  Procedures (including critical care time) Results for orders placed or performed during the hospital encounter of 04/26/15  CBC with Differential/Platelet  Result Value Ref Range   WBC 6.0 4.0 - 10.5 K/uL   RBC 5.84  (H) 4.22 - 5.81 MIL/uL   Hemoglobin 16.0 13.0 - 17.0 g/dL   HCT 40.9 81.1 - 91.4 %   MCV 81.2 78.0 - 100.0 fL   MCH 27.4 26.0 - 34.0 pg   MCHC 33.8 30.0 - 36.0 g/dL   RDW 78.2 95.6 - 21.3 %   Platelets 120 (L) 150 - 400 K/uL   Neutrophils Relative % 58 43 - 77 %   Neutro Abs 3.4 1.7 - 7.7 K/uL   Lymphocytes Relative 27 12 - 46 %   Lymphs Abs 1.6 0.7 - 4.0 K/uL   Monocytes Relative 8 3 - 12 %   Monocytes Absolute 0.5 0.1 - 1.0 K/uL   Eosinophils Relative 7 (H) 0 - 5 %   Eosinophils Absolute 0.4 0.0 - 0.7 K/uL   Basophils Relative 0 0 - 1 %   Basophils Absolute 0.0 0.0 - 0.1 K/uL  I-stat chem 8, ed  Result Value Ref Range   Sodium 139 135 - 145 mmol/L   Potassium 3.9 3.5 - 5.1 mmol/L   Chloride 102 101 - 111 mmol/L   BUN 8 6 - 20 mg/dL   Creatinine, Ser 0.86 0.61 - 1.24 mg/dL   Glucose, Bld 83 70 - 99 mg/dL   Calcium, Ion 5.78 4.69 - 1.23 mmol/L   TCO2 23 0 - 100 mmol/L   Hemoglobin 18.0 (H) 13.0 - 17.0 g/dL   HCT 62.9 (H) 52.8 - 41.3 %   Dg Chest 2 View  04/26/2015   CLINICAL DATA:  Shortness of breath. Asthma exacerbation. Dry cough for 3-4 days.  EXAM: CHEST  2 VIEW  COMPARISON:  07/19/2012  FINDINGS: Heart and mediastinal contours are within normal limits. No focal opacities or effusions. No acute bony abnormality. Mild thoracolumbar scoliosis.  IMPRESSION: No active cardiopulmonary disease.   Electronically Signed   By: Charlett Nose M.D.   On: 04/26/2015 14:03     Imaging Review Dg Chest 2 View  04/26/2015   CLINICAL DATA:  Shortness of breath. Asthma exacerbation. Dry cough for 3-4 days.  EXAM: CHEST  2 VIEW  COMPARISON:  07/19/2012  FINDINGS: Heart and mediastinal contours are within normal limits. No focal opacities or effusions. No acute bony abnormality. Mild thoracolumbar scoliosis.  IMPRESSION: No active cardiopulmonary disease.   Electronically Signed   By: Charlett Nose M.D.   On: 04/26/2015 14:03     EKG Interpretation None      MDM   Final diagnoses:   Asthma exacerbation    Patient with asthma exacerbation, will give breathing treatment, and prednisone. Will reassess.  Patient reassessed, states he feels better, but is still having some wheezing. Will give continuous nebs.  Patient feels improved after hour-long nebulizer. Ambulates maintaining greater than 96% oxygen saturation. States that he is feeling better. No wheezes heard on reexam. Will discharge to home with prednisone and inhaler. Will recommend primary care versus pulmonology follow-up. Patient understands and agrees plan. Return precautions provided. He is stable and ready for discharge.   Roxy Horseman, PA-C 04/26/15 1606  Arby Barrette, MD 04/28/15 [phone]

## 2015-04-26 NOTE — ED Notes (Signed)
[facility].  All questions answered and pt verbalized understanding during discharge.

## 2015-04-26 NOTE — ED Notes (Signed)
RT notified

## 2015-04-27 DIAGNOSIS — J4550 Severe persistent asthma, uncomplicated: Secondary | ICD-10-CM | POA: Insufficient documentation

## 2015-04-27 DIAGNOSIS — J4531 Mild persistent asthma with (acute) exacerbation: Secondary | ICD-10-CM

## 2015-04-27 DIAGNOSIS — J45901 Unspecified asthma with (acute) exacerbation: Secondary | ICD-10-CM | POA: Diagnosis present

## 2015-04-27 DIAGNOSIS — J45909 Unspecified asthma, uncomplicated: Secondary | ICD-10-CM | POA: Diagnosis present

## 2015-04-27 MED ORDER — FLUTICASONE PROPIONATE HFA 44 MCG/ACT IN AERO
1.0000 | INHALATION_SPRAY | Freq: Two times a day (BID) | RESPIRATORY_TRACT | Status: DC
  Filled 2015-04-27: qty 10.6

## 2015-04-27 MED ORDER — ALBUTEROL SULFATE HFA 108 (90 BASE) MCG/ACT IN AERS: 2.0000 | INHALATION_SPRAY | RESPIRATORY_TRACT | Status: DC | PRN

## 2015-04-27 MED ORDER — PREDNISONE 50 MG PO TABS: 50.0000 mg | ORAL_TABLET | Freq: Every day | ORAL | Status: DC

## 2015-04-27 MED ORDER — TIOTROPIUM BROMIDE MONOHYDRATE 18 MCG IN CAPS: 18.0000 ug | ORAL_CAPSULE | Freq: Every day | RESPIRATORY_TRACT | Status: DC

## 2015-04-27 MED ORDER — SODIUM CHLORIDE 0.9 % IJ SOLN: 3.0000 mL | INTRAMUSCULAR | Status: DC | PRN

## 2015-04-27 MED ORDER — ALBUTEROL SULFATE (2.5 MG/3ML) 0.083% IN NEBU
2.5000 mg | INHALATION_SOLUTION | RESPIRATORY_TRACT | Status: DC
  Administered 2015-04-27: 2.5 mg via RESPIRATORY_TRACT
  Filled 2015-04-27 (×2): qty 3

## 2015-04-27 MED ORDER — ALBUTEROL SULFATE (2.5 MG/3ML) 0.083% IN NEBU
2.5000 mg | INHALATION_SOLUTION | RESPIRATORY_TRACT | Status: DC
  Administered 2015-04-27: 2.5 mg via RESPIRATORY_TRACT
  Filled 2015-04-27: qty 3

## 2015-04-27 MED ORDER — ALBUTEROL SULFATE (2.5 MG/3ML) 0.083% IN NEBU: 2.5000 mg | INHALATION_SOLUTION | RESPIRATORY_TRACT | Status: DC | PRN

## 2015-04-27 MED ORDER — SODIUM CHLORIDE 0.9 % IV SOLN: 250.0000 mL | INTRAVENOUS | Status: DC | PRN

## 2015-04-27 MED ORDER — AEROCHAMBER PLUS W/MASK MISC: Status: DC

## 2015-04-27 MED ORDER — PREDNISONE 50 MG PO TABS
50.0000 mg | ORAL_TABLET | Freq: Every day | ORAL | Status: DC
  Administered 2015-04-27: 50 mg via ORAL
  Filled 2015-04-27 (×2): qty 1

## 2015-04-27 MED ORDER — LORATADINE 10 MG PO TABS
10.0000 mg | ORAL_TABLET | Freq: Every day | ORAL | Status: DC
  Administered 2015-04-27: 10 mg via ORAL
  Filled 2015-04-27: qty 1

## 2015-04-27 MED ORDER — ALBUTEROL SULFATE (2.5 MG/3ML) 0.083% IN NEBU: 2.5000 mg | INHALATION_SOLUTION | RESPIRATORY_TRACT | Status: DC

## 2015-04-27 MED ORDER — ALBUTEROL SULFATE (2.5 MG/3ML) 0.083% IN NEBU
5.0000 mg | INHALATION_SOLUTION | RESPIRATORY_TRACT | Status: AC | PRN
  Administered 2015-04-27 (×2): 5 mg via RESPIRATORY_TRACT
  Filled 2015-04-27 (×2): qty 6

## 2015-04-27 MED ORDER — TIOTROPIUM BROMIDE MONOHYDRATE 18 MCG IN CAPS
18.0000 ug | ORAL_CAPSULE | Freq: Every day | RESPIRATORY_TRACT | Status: DC
  Administered 2015-04-27: 18 ug via RESPIRATORY_TRACT
  Filled 2015-04-27: qty 5

## 2015-04-27 MED ORDER — IPRATROPIUM-ALBUTEROL 0.5-2.5 (3) MG/3ML IN SOLN: 3.0000 mL | RESPIRATORY_TRACT | Status: DC

## 2015-04-27 MED ORDER — FLUTICASONE PROPIONATE HFA 110 MCG/ACT IN AERO
2.0000 | INHALATION_SPRAY | Freq: Two times a day (BID) | RESPIRATORY_TRACT | Status: DC
  Administered 2015-04-27: 2 via RESPIRATORY_TRACT
  Filled 2015-04-27: qty 12

## 2015-04-27 MED ORDER — PREDNISONE 50 MG PO TABS: ORAL_TABLET | ORAL | Status: DC

## 2015-04-27 MED ORDER — METHYLPREDNISOLONE SODIUM SUCC 125 MG IJ SOLR
125.0000 mg | Freq: Once | INTRAMUSCULAR | Status: AC
  Administered 2015-04-27: 125 mg via INTRAVENOUS
  Filled 2015-04-27: qty 2

## 2015-04-27 MED ORDER — AEROCHAMBER PLUS W/MASK MISC
1.0000 | Freq: Once | Status: DC
  Filled 2015-04-27 (×2): qty 1

## 2015-04-27 MED ORDER — SODIUM CHLORIDE 0.9 % IJ SOLN
3.0000 mL | Freq: Two times a day (BID) | INTRAMUSCULAR | Status: DC
  Administered 2015-04-27: 3 mL via INTRAVENOUS

## 2015-04-27 MED ORDER — ACETAMINOPHEN 325 MG PO TABS
650.0000 mg | ORAL_TABLET | Freq: Four times a day (QID) | ORAL | Status: DC | PRN
  Administered 2015-04-27: 650 mg via ORAL
  Filled 2015-04-27: qty 2

## 2015-04-27 NOTE — Progress Notes (Signed)
Pre-Treatment Peak flow - 220 Post-Tx Peak Flow-280

## 2015-04-27 NOTE — Progress Notes (Signed)
[name] [id number] Admission Data: 04/27/2015 4:25 AM Attending Provider: Leighton Roachodd D McDiarmid, MD VQQ:VZDGLOPCP:Street, Cristal Deerhristopher, MD Code Status: Full  [name] is a 29 y.o. male patient admitted from ED:  -No acute distress noted.  -No complaints of shortness of breath.  -No complaints of chest pain.   Cardiac Monitoring: Box # 03 in place. Cardiac monitor yields:sinus tachycardia.  Blood pressure 130/80, pulse 139, temperature 97.6 F (36.4 C), temperature source Oral, resp. rate 22, height 5\' 6"  (1.676 m), weight 66.134 kg (145 lb 12.8 oz), SpO2 99 %.   IV Fluids:  IV in place, occlusive dsg intact without redness, IV cath hand left, condition patent and no redness none.   Allergies:  Review of patient's allergies indicates no known allergies.  Past Medical History:   has a past medical history of Allergy and Asthma.  Past Surgical History:   has no past surgical history on file.  Social History:   reports that he has never smoked. He has never used smokeless tobacco. He reports that he uses illicit drugs (Marijuana). He reports that he does not drink alcohol.  Skin: NSI  Patient/Family orientated to room. Information packet given to patient/family. Admission inpatient armband information verified with patient/family to include name and date of birth and placed on patient arm. Side rails up x 2, fall assessment and education completed with patient/family. Patient/family able to verbalize understanding of risk associated with falls and verbalized understanding to call for assistance before getting out of bed. Call light within reach. Patient/family able to voice and demonstrate understanding of unit orientation instructions.

## 2015-04-27 NOTE — H&P (Signed)
[facility] Admission History and Physical Service Pager: [phone]  Patient name: [name] Medical record number: [id number] Date of birth: 06-Jul-1986 Age: 29 y.o. Gender: male  Primary Care Provider: Maryjean KaStreet, Christopher, MD Consultants: None Code Status: Full  Chief Complaint: Asthma Exacerbation  Assessment and Plan: [name] is a 29 y.o. male presenting with shortness of breath concerning for asthma exacerbation . PMH is significant for asthma  Acute COPD exacerbation: Symptoms are consistent with asthma exacerbation. Likely source of exacerbation is recent URI which started Sunday.  Patient has not been seen in our clinic in 2 years and is not on a controller medicine.  CXR in the ED negative for acute pulmonary process.  Currently stable on 2 L O2. No current increased WOB/accessory muscle use.   - Duonebs q4/albuterol q2hr PRN - Prednisone 50mg  daily to complete 5 day course - Will start controller medication, as he has been on flovent in the past will to begin 1 puff BID - supplemental O2 as needed, wean as tolerated - monitor O2 sats  FEN/GI: Regular diet, KVO Prophylaxis: SCDs  Disposition: Admission to Family medicine  History of Present Illness: [name] is a 29 y.o. male presenting with SOB concerning for asthma attack. He started to get URI type symptoms on 5/1 with runny nose which progressed to progressive shortness of breath with wheezing. He went to the ED here on 5/1, was treated then discharged but his symptoms returned later that day and continued to progress. He then presented to the ED here today, was treated and discharged the [facility]. He denies productive cough, fevers/chills, nausea/diarrhea but he has begun to have chest soreness when he breaths deeply. He denies recent sick contacts. His usual triggers include exercise for which he takes albuterol after  working out. He has never been intubated. He does not have insurance so he has not seen a PCP for 2 years. He does not use a controller medication [facility], then [facility] with continued SOB  Review Of Systems: Per HPI with the following additions otherwise 12 point review of systems was performed and was unremarkable.  Patient Active Problem List   Diagnosis Date Noted  . Asthma exacerbation attacks 04/27/2015  . HPV (human papilloma virus) anogenital infection 01/09/2013  . Seasonal allergies 04/18/2012  . Exposure to STD 04/18/2012  . Family history of diabetes mellitus 04/18/2012  . ACNE, MILD 08/10/2008  . Exercise-induced asthma 02/20/2007   Past Medical History: Past Medical History  Diagnosis Date  . Allergy   . Asthma    Past Surgical History: History reviewed. No pertinent past surgical history. Social History: History  Substance Use Topics  . Smoking status: Never Smoker   . Smokeless tobacco: Never Used  . Alcohol Use: No   Additional social history: smokes occasional weed, denies etoh or tobacco Please also refer to relevant sections of EMR.  Family History: Family History  Problem Relation Age of Onset  . Hypertension Mother    Allergies and Medications: No Known Allergies No current facility-administered medications on file prior to encounter.   Current Outpatient Prescriptions on File Prior to Encounter  Medication Sig Dispense Refill  . albuterol (PROVENTIL HFA;VENTOLIN HFA) 108 (90 BASE) MCG/ACT inhaler Inhale 2 puffs into the lungs every 4 (four) hours as needed for wheezing or shortness  of breath. 1 Inhaler 3  . predniSONE (DELTASONE) 20 MG tablet Take 2 tablets (40 mg total) by mouth daily. 10 tablet 0  . ondansetron (ZOFRAN ODT) 4 MG disintegrating tablet 4mg  ODT q4 hours prn nausea/vomit (Patient not taking: Reported on 04/26/2015) 6 tablet 0  .  oxyCODONE (ROXICODONE) 5 MG immediate release tablet Take 1 tablet (5 mg total) by mouth every 6 (six) hours as needed for severe pain. (Patient not taking: Reported on 04/26/2015) 10 tablet 0    Objective: BP 130/80 mmHg  Pulse 139  Temp(Src) 97.6 F (36.4 C) (Oral)  Resp 22  Ht 5\' 6"  (1.676 m)  Wt 145 lb 12.8 oz (66.134 kg)  BMI 23.54 kg/m2  SpO2 99% Exam: General: NAD, sitting in bed comfortably HEENT: NCAT, MMM Cardiovascular: Tachycardic, regular rhythm, no murmurs Respiratory: Wheezes throughout all lung fields, normal WOB, no accessory muscle use Abdomen: soft, non-tender, non distended, no organomegally Extremities: No LE edema, Skin: warm, well perfused no rashes or lesions Neuro: AO, no focal deficits  Labs and Imaging: CBC BMET   Recent Labs Lab 04/26/15 1306 04/26/15 1419  WBC 6.0  --   HGB 16.0 18.0*  HCT 47.4 53.0*  PLT 120*  --     Recent Labs Lab 04/26/15 1419  NA 139  K 3.9  CL 102  BUN 8  CREATININE 0.90  GLUCOSE 83     5/3 CXR  FINDINGS: Heart and mediastinal contours are within normal limits. No focal opacities or effusions. No acute bony abnormality. Mild thoracolumbar scoliosis.  IMPRESSION: No active cardiopulmonary disease.  Bonney AidAlyssa A Haney, MD 04/27/2015, 4:27 AM PGY-1, [facility] FPTS Intern pager: [phone], text pages welcome  I have seen and evaluated the above patient.  I agree with the note. Addendum in blue.  Everlene OtherJayce Merriel Zinger DO Family Medicine PGY-3 Pager: [phone]

## 2015-04-27 NOTE — Progress Notes (Signed)
Patient presented with asthma exacerbation. Post CAT with albuterol and Magnesium, continues to be unable to perform peak flow adequately. Diffuse wheezing heard on exam. Dyspnea present with mild orthopnea and prolonged exhalation. Slight tachypnea noted as well as tachycardia. Wheeze protocol order noted. RT treatment assessment protocol completed. Orders placed for scheduled BD Q4 hours.

## 2015-04-27 NOTE — Progress Notes (Signed)
[facility].

## 2015-04-27 NOTE — Progress Notes (Signed)
Pt will not be on tele per MD.

## 2015-04-27 NOTE — ED Provider Notes (Signed)
Medical screening examination/treatment/procedure(s) were conducted as a shared visit with non-physician practitioner(s) and myself.  I personally evaluated the patient during the encounter.    Pt is here today for the second time for persistent wheezing associated with an asthma exacerbation.  On exam he is tachycardic and wheezing.  Accessory muscle use.  Failed outpatient treatment.  Will admit for further treatment and evaluation.  Linwood DibblesJon Kelsea Mousel, MD 04/27/15 [phone]

## 2015-04-27 NOTE — Progress Notes (Signed)
Discharge instructions completed.  Medications list reviewed with patient. Verbalizes understanding.

## 2015-04-27 NOTE — Discharge Summary (Signed)
Family Medicine [facility] Discharge Summary  Patient name: [name] Medical record number: [id number] Date of birth: [birth year] Age: 29 y.o. Gender: male Date of Admission: 04/26/2015  Date of Discharge: 04/27/2015  Admitting Physician: Leighton Roachodd D McDiarmid, MD  Primary Care Provider: Maryjean KaStreet, Christopher, MD Consultants: None Indication for Hospitalization:  Asthma exacerbation  Discharge Diagnoses/Problem List:  Patient Active Problem List   Diagnosis Date Noted  . Asthma exacerbation attacks 04/27/2015  . Asthma 04/27/2015  . Asthma with acute exacerbation   . HPV (human papilloma virus) anogenital infection 01/09/2013  . Seasonal allergies 04/18/2012  . Exposure to STD 04/18/2012  . Family history of diabetes mellitus 04/18/2012  . ACNE, MILD 08/10/2008  . Exercise-induced asthma 02/20/2007     Disposition: Home  Discharge Condition: Stable  Discharge Exam:   BP 142/85 mmHg  Pulse 117  Temp(Src) 98.1 F (36.7 C) (Oral)  Resp 20  Ht 5\' 6"  (1.676 m)  Wt 145 lb 12.8 oz (66.134 kg)  BMI 23.54 kg/m2  SpO2 96% Exam: General: NAD, sitting in bed comfortably HEENT: NCAT, MMM Cardiovascular: Tachycardic, regular rhythm, no murmurs Respiratory: Improved wheezing, normal WOB, no accessory muscle use Abdomen: soft, non-tender, non distended, no organomegally Extremities: No LE edema, Skin: warm, well perfused no rashes or lesions Neuro: AO, no focal deficits  Brief Hospital Course:   [name] is a 29 y.o. male presenting with shortness of breath concerning for asthma exacerbation . PMH is significant for asthma On admission his exam eas significant for diffuse wheezes. He received albuterol in the ED as well as steroids with minimal improvement. He has not been seen by his PCP for approximately 2 years and stopped taking his controller medication in te setting of losing his insurance.  He was continued on prednisone 50, albuterol q2hr  PRN and duonebs q4hr and the fluticasone he had been managed with prior. He did not have a supplemental O2 requirement. By the AM after admission his symptoms markedly improved. He was switched to spiriva to be continued as an outpatient   Issues for Follow Up:  1. Asthma symptoms 2. Compliance with medication regimen  Significant Procedures:   None  Significant Labs and Imaging:   Recent Labs Lab 04/26/15 1306 04/26/15 1419  WBC 6.0  --   HGB 16.0 18.0*  HCT 47.4 53.0*  PLT 120*  --     Recent Labs Lab 04/26/15 1419  NA 139  K 3.9  CL 102  GLUCOSE 83  BUN 8  CREATININE 0.90      Results/Tests Pending at Time of Discharge:  None  Discharge Medications:    Medication List    ASK your doctor about these medications        albuterol 108 (90 BASE) MCG/ACT inhaler  Commonly known as:  PROVENTIL HFA;VENTOLIN HFA  Inhale 2 puffs into the lungs every 4 (four) hours as needed for wheezing or shortness of breath.     ondansetron 4 MG disintegrating tablet  Commonly known as:  ZOFRAN ODT  4mg  ODT q4 hours prn nausea/vomit     oxyCODONE 5 MG immediate release tablet  Commonly known as:  ROXICODONE  Take 1 tablet (5 mg total) by mouth every 6 (six) hours as needed for severe pain.     predniSONE 20 MG tablet  Commonly known as:  DELTASONE  Take 2 tablets (40 mg total) by mouth daily.        Discharge Instructions: Please refer to Patient Instructions  section of EMR for full details.  Patient was counseled important signs and symptoms that should prompt return to medical care, changes in medications, dietary instructions, activity restrictions, and follow up appointments.   Follow-Up Appointments:     Follow-up Information    Follow up with Maryjean KaStreet, Christopher, MD On 05/05/2015.   Specialty:  Family Medicine   Why:  11:00   Contact information:   [address] [place] [address] [phone]       Bonney AidAlyssa A Monigue Spraggins, MD 04/27/2015, 11:24  AM PGY-1, [facility]

## 2015-05-05 ENCOUNTER — Ambulatory Visit: Payer: 59 | Admitting: Family Medicine

## 2015-05-10 ENCOUNTER — Encounter: Payer: Self-pay | Admitting: Family Medicine

## 2015-05-10 ENCOUNTER — Ambulatory Visit (INDEPENDENT_AMBULATORY_CARE_PROVIDER_SITE_OTHER): Payer: Self-pay | Admitting: Family Medicine

## 2015-05-10 VITALS — BP 123/74 | HR 76 | Temp 98.0°F | Ht 66.0 in | Wt 152.0 lb

## 2015-05-10 DIAGNOSIS — J4540 Moderate persistent asthma, uncomplicated: Secondary | ICD-10-CM

## 2015-05-10 MED ORDER — FLUTICASONE PROPIONATE HFA 44 MCG/ACT IN AERO: 1.0000 | INHALATION_SPRAY | Freq: Two times a day (BID) | RESPIRATORY_TRACT | Status: DC

## 2015-05-10 MED ORDER — ALBUTEROL SULFATE HFA 108 (90 BASE) MCG/ACT IN AERS: 2.0000 | INHALATION_SPRAY | RESPIRATORY_TRACT | Status: DC | PRN

## 2015-05-10 NOTE — Patient Instructions (Signed)
Thank you for coming in, today!  Your lungs sound fine, this morning. Keep taking Flovent twice per day and albuterol up to every 4 hours as needed. I will give you refills on Flovent and albuterol.  I would recommend you come in to be seen at least once per year. My last day is June 30th. Call or come back to see me any time between now and then, as needed. You can call after July 1st to see who your doctor will be after me, and you can come in July to meet them or wait until you are due for an appointment next year.  Please feel free to call with any questions or concerns at any time, at [phone]. --Dr. Casper HarrisonStreet

## 2015-05-10 NOTE — Progress Notes (Signed)
   Subjective:    Patient ID: [name], male    DOB: 1986-08-07, 29 y.o.   MRN: [id number]  HPI: Pt presents to clinic for hospital f/u for asthma exacerbation. He was admitted 5/3 to 5/4 and had good improvement in his symptoms with steroid and nebulized / inhaled medication treatment. He was discharged with Flovent, Spiriva, and albuterol. He uses Flovent twice per day and finished the Spiriva he was sent home with, but was unable to afford the prescription for it. He completed a course of prednisone. He uses an albuterol nebulizer about once per day "preemptively" due to allergies. He takes Claritin otherwise for allergies. Since discharge, he has felt "completely better" and has no symptoms at all. He states his asthma only flares during season changes, with colds, or sometimes with exercise.  Of note, pt does admit to smoking marijuana about once per week. He states this does not make his symptoms worse at all. He does not use tobacco wraps any more and now uses a pipe (he used a tobacco wrap prior to this hospitalization). Overall his symptoms ar well-controlled with medications and he generally "doesn't have any symptoms" unless they flare with weather / exercise / etc.  Review of Systems: As above.     Objective:   Physical Exam BP 123/74 mmHg  Pulse 76  Temp(Src) 98 F (36.7 C)  Ht 5\' 6"  (1.676 m)  Wt 152 lb (68.947 kg)  BMI 24.55 kg/m2  SpO2 98% Gen: well-appearing young adult male HEENT: [place]/AT, EOMI, PERRLA, MMM, TM's clear bilaterally  Nasal mucosae and posterior oropharynx clear Neck: supple, normal ROM, no lymphadenopathy Cardio: RRR, no murmur appreciated Pulm: CTAB, no wheezes, normal WOB Abd: soft, nontender, BS+ Ext: warm, well-perfused, no LE edema     Assessment & Plan:  See problem list note.

## 2015-05-10 NOTE — Assessment & Plan Note (Signed)
A: Moderate-to-severe persistent asthma, worse with changes in weather / allergies / exercise. Compliant with Flovent daily (previously had stopped it due to problems with insurance), albuterol PRN. Unable to afford Spiriva but symptoms now completely resolved back on Flovent and s/p course of prednisone. Well-appearing with normal lung sounds, today.  P: Continue Flovent and albuterol; new Rx's given today. Continue daily Claritin. Hold off on Spiriva for now (removed from med list), but could consider Singulair in the future (?cheaper than Spiriva). Strongly recommended f/u at least once yearly (pt's last clinic visit prior to this was >2 years ago) and advised f/u after July 1 to meet his new PCP after I graduate. Otherwise f/u PRN.

## 2015-08-17 ENCOUNTER — Telehealth: Payer: Self-pay

## 2015-08-17 NOTE — Telephone Encounter (Signed)
Message left on machine. Call for intake appointment Referral from [facility] Dept  .  Laurell Josephs, RN

## 2015-08-17 NOTE — Telephone Encounter (Signed)
Patient contacted regarding new intake appointment. Date and time given. Information given regarding documents needed to qualify for financial eligibility.  Darby Fleeman K Kemani Demarais, RN  

## 2015-08-25 ENCOUNTER — Ambulatory Visit (INDEPENDENT_AMBULATORY_CARE_PROVIDER_SITE_OTHER): Payer: Self-pay

## 2015-08-25 DIAGNOSIS — Z79899 Other long term (current) drug therapy: Secondary | ICD-10-CM

## 2015-08-25 DIAGNOSIS — Z113 Encounter for screening for infections with a predominantly sexual mode of transmission: Secondary | ICD-10-CM

## 2015-08-25 DIAGNOSIS — B20 Human immunodeficiency virus [HIV] disease: Secondary | ICD-10-CM

## 2015-08-25 DIAGNOSIS — Z23 Encounter for immunization: Secondary | ICD-10-CM

## 2015-08-25 LAB — LIPID PANEL
Cholesterol: 180 mg/dL (ref 125–200)
HDL: 42 mg/dL (ref >=40)
LDL CALC: 126 mg/dL (ref <130)
Total CHOL/HDL Ratio: 4.3 Ratio (ref <=5.0)
Triglycerides: 60 mg/dL (ref <150)
VLDL: 12 mg/dL (ref <30)

## 2015-08-25 LAB — COMPLETE METABOLIC PANEL WITH GFR
ALT: 16 U/L (ref 9–46)
AST: 19 U/L (ref 10–40)
Albumin: 4.8 g/dL (ref 3.6–5.1)
Alkaline Phosphatase: 53 U/L (ref 40–115)
BUN: 16 mg/dL (ref 7–25)
CALCIUM: 9.5 mg/dL (ref 8.6–10.3)
CHLORIDE: 100 mmol/L (ref 98–110)
CO2: 27 mmol/L (ref 20–31)
Creat: 1.06 mg/dL (ref 0.60–1.35)
GFR, Est African American: >89 mL/min (ref >=60)
Glucose, Bld: 72 mg/dL (ref 65–99)
POTASSIUM: 4.4 mmol/L (ref 3.5–5.3)
Sodium: 137 mmol/L (ref 135–146)
Total Bilirubin: 1.3 mg/dL — ABNORMAL HIGH (ref 0.2–1.2)
Total Protein: 7.7 g/dL (ref 6.1–8.1)

## 2015-08-25 LAB — CBC WITH DIFFERENTIAL/PLATELET
Basophils Absolute: 0 10*3/uL (ref 0.0–0.1)
Basophils Relative: 0 % (ref 0–1)
EOS PCT: 7 % — AB (ref 0–5)
Eosinophils Absolute: 0.4 10*3/uL (ref 0.0–0.7)
HEMATOCRIT: 47.6 % (ref 39.0–52.0)
Hemoglobin: 16.1 g/dL (ref 13.0–17.0)
LYMPHS PCT: 37 % (ref 12–46)
Lymphs Abs: 1.9 10*3/uL (ref 0.7–4.0)
MCH: 27.3 pg (ref 26.0–34.0)
MCHC: 33.8 g/dL (ref 30.0–36.0)
MCV: 80.8 fL (ref 78.0–100.0)
MONO ABS: 0.7 10*3/uL (ref 0.1–1.0)
MPV: 10.1 fL (ref 8.6–12.4)
Monocytes Relative: 13 % — ABNORMAL HIGH (ref 3–12)
Neutro Abs: 2.2 10*3/uL (ref 1.7–7.7)
Neutrophils Relative %: 43 % (ref 43–77)
Platelets: 146 10*3/uL — ABNORMAL LOW (ref 150–400)
RBC: 5.89 MIL/uL — ABNORMAL HIGH (ref 4.22–5.81)
RDW: 14.5 % (ref 11.5–15.5)
WBC: 5 10*3/uL (ref 4.0–10.5)

## 2015-08-25 LAB — RPR

## 2015-08-26 LAB — URINALYSIS
Bilirubin Urine: NEGATIVE
Glucose, UA: NEGATIVE
Hgb urine dipstick: NEGATIVE
Ketones, ur: NEGATIVE
LEUKOCYTES UA: NEGATIVE
Nitrite: NEGATIVE
PROTEIN: NEGATIVE
Specific Gravity, Urine: 1.02 (ref 1.001–1.035)
pH: 6.5 (ref 5.0–8.0)

## 2015-08-26 LAB — T-HELPER CELL (CD4) - (RCID CLINIC ONLY)
CD4 % Helper T Cell: 27 % — ABNORMAL LOW (ref 33–55)
CD4 T Cell Abs: 500 /uL (ref 400–2700)

## 2015-08-26 LAB — HEPATITIS B CORE ANTIBODY, TOTAL: Hep B Core Total Ab: NONREACTIVE

## 2015-08-26 LAB — URINE CYTOLOGY ANCILLARY ONLY
CHLAMYDIA, DNA PROBE: NEGATIVE
Neisseria Gonorrhea: NEGATIVE

## 2015-08-26 LAB — HEPATITIS B SURFACE ANTIBODY,QUALITATIVE: HEP B S AB: NEGATIVE

## 2015-08-26 LAB — HEPATITIS C ANTIBODY: HCV AB: NEGATIVE

## 2015-08-26 LAB — HEPATITIS B SURFACE ANTIGEN: Hepatitis B Surface Ag: NEGATIVE

## 2015-08-26 LAB — HEPATITIS A ANTIBODY, TOTAL: Hep A Total Ab: NONREACTIVE

## 2015-08-27 LAB — QUANTIFERON TB GOLD ASSAY (BLOOD)
Interferon Gamma Release Assay: NEGATIVE
Mitogen value: 8.64 IU/mL
QUANTIFERON TB AG MINUS NIL: 0.01 [IU]/mL
Quantiferon Nil Value: 0.1 IU/mL
TB Ag value: 0.11 IU/mL

## 2015-08-28 LAB — HIV-1 RNA ULTRAQUANT REFLEX TO GENTYP+
HIV 1 RNA QUANT: 13243 {copies}/mL — AB (ref <20)
HIV-1 RNA QUANT, LOG: 4.12 {Log} — AB (ref <1.30)

## 2015-09-01 LAB — HLA B*5701: HLA-B 5701 W/RFLX HLA-B HIGH: NEGATIVE

## 2015-09-05 NOTE — Progress Notes (Signed)
Patient referred by [facility] Department after testing positive for HIV.  His last negative HIV test was January, 2015. His last unprotected intercourse was March 2016 and reports condom use as often. His family is aware of HIV diagnosis and he has good support. He is ready to start medications.   Laurell Josephs, RN

## 2015-09-07 LAB — HIV-1 GENOTYPR PLUS

## 2015-09-19 ENCOUNTER — Ambulatory Visit (INDEPENDENT_AMBULATORY_CARE_PROVIDER_SITE_OTHER): Payer: Self-pay | Admitting: Infectious Disease

## 2015-09-19 ENCOUNTER — Encounter: Payer: Self-pay | Admitting: Infectious Disease

## 2015-09-19 VITALS — BP 130/81 | HR 63 | Temp 98.2°F | Wt 158.0 lb

## 2015-09-19 DIAGNOSIS — B20 Human immunodeficiency virus [HIV] disease: Secondary | ICD-10-CM

## 2015-09-19 DIAGNOSIS — Z23 Encounter for immunization: Secondary | ICD-10-CM

## 2015-09-19 DIAGNOSIS — J4520 Mild intermittent asthma, uncomplicated: Secondary | ICD-10-CM

## 2015-09-19 DIAGNOSIS — J302 Other seasonal allergic rhinitis: Secondary | ICD-10-CM

## 2015-09-19 HISTORY — DX: Human immunodeficiency virus (HIV) disease: B20

## 2015-09-19 MED ORDER — EMTRICITAB-RILPIVIR-TENOFOV AF 200-25-25 MG PO TABS: 1.0000 | ORAL_TABLET | Freq: Every day | ORAL | Status: DC

## 2015-09-19 NOTE — Progress Notes (Signed)
Subjective:    Patient ID: [name], male    DOB: [birth year], 29 y.o.   MRN: [id number]  HPI  29 year old [name] with newly diagnosed HIV infection. He had been tested at least yearly for multiple years last checked in 2015 January. His viral load is not especially high 13,000 range and CD4 count is healthy at 500. His resistance testing shows no evidence of resistance to NNRTI's and RTIs or PI's. He is HLA B571 negative he does not have viral hepatitis.  Past Medical History  Diagnosis Date  . Allergy   . Asthma     History reviewed. No pertinent past surgical history.  Family History  Problem Relation Age of Onset  . Hypertension Mother       reports that he has never smoked. He has never used smokeless tobacco. He reports that he does not drink alcohol or use illicit drugs.   Family History  Problem Relation Age of Onset  . Hypertension Mother     No Known Allergies   Current outpatient prescriptions:  .  albuterol (PROVENTIL HFA;VENTOLIN HFA) 108 (90 BASE) MCG/ACT inhaler, Inhale 2 puffs into the lungs every 4 (four) hours as needed for wheezing or shortness of breath., Disp: 1 Inhaler, Rfl: 3 .  albuterol (PROVENTIL) (2.5 MG/3ML) 0.083% nebulizer solution, Take 3 mLs (2.5 mg total) by nebulization every 4 (four) hours., Disp: 75 mL, Rfl: 12 .  fluticasone (FLOVENT HFA) 44 MCG/ACT inhaler, Inhale 1 puff into the lungs 2 (two) times daily., Disp: 1 Inhaler, Rfl: 11 .  loratadine (CLARITIN) 10 MG tablet, Take 10 mg by mouth daily., Disp: , Rfl:  .  Spacer/Aero-Holding Chambers (AEROCHAMBER PLUS WITH MASK) inhaler, With albuterol inhaler, Disp: 1 each, Rfl: 2 .  emtricitabine-rilpivir-tenofovir AF (ODEFSEY) 200-25-25 MG TABS per tablet, Take 1 tablet by mouth daily with breakfast., Disp: 30 tablet, Rfl: 11   Review of Systems  Constitutional: Negative for fever, chills, diaphoresis, activity change, appetite change, fatigue and unexpected weight  change.  HENT: Negative for congestion, rhinorrhea, sinus pressure, sneezing, sore throat and trouble swallowing.   Eyes: Negative for photophobia and visual disturbance.  Respiratory: Negative for cough, chest tightness, shortness of breath, wheezing and stridor.   Cardiovascular: Negative for chest pain, palpitations and leg swelling.  Gastrointestinal: Negative for nausea, vomiting, abdominal pain, diarrhea, constipation, blood in stool, abdominal distention and anal bleeding.  Genitourinary: Negative for dysuria, hematuria, flank pain and difficulty urinating.  Musculoskeletal: Negative for myalgias, back pain, joint swelling, arthralgias and gait problem.  Skin: Negative for color change, pallor, rash and wound.  Neurological: Negative for dizziness, tremors, weakness and light-headedness.  Hematological: Negative for adenopathy. Does not bruise/bleed easily.  Psychiatric/Behavioral: Negative for behavioral problems, confusion, sleep disturbance, dysphoric mood, decreased concentration and agitation.       Objective:   Physical Exam  Constitutional: He is oriented to person, place, and time. He appears well-developed and well-nourished.  HENT:  Head: Normocephalic and atraumatic.  Eyes: Conjunctivae and EOM are normal.  Neck: Normal range of motion. Neck supple.  Cardiovascular: Normal rate and regular rhythm.   Pulmonary/Chest: Effort normal. No respiratory distress. He has no wheezes.  Abdominal: Soft. He exhibits no distension.  Musculoskeletal: Normal range of motion. He exhibits no edema or tenderness.  Neurological: He is alert and oriented to person, place, and time.  Skin: Skin is warm and dry. No rash noted. No erythema. No pallor.  Psychiatric: He has a normal mood  and affect. His behavior is normal. Judgment and thought content normal.          Assessment & Plan:  HIV disease:  After thorough discussion we decided on ODEFSEY. He would've also liked Genvoya but  there is significant drug drug interaction with COBI and his inhaled steroid  Asthma: has seasonal variance to it: he does take inhaled steroid when it is flaring along with rescue inhaler.  I spent greater than 45 minutes with the patient including greater than 50% of time in face to face counsel of the patient re his HIV, new ARV regimen, asthma  and in coordination of their care.

## 2015-10-17 ENCOUNTER — Ambulatory Visit (INDEPENDENT_AMBULATORY_CARE_PROVIDER_SITE_OTHER): Payer: Self-pay | Admitting: *Deleted

## 2015-10-17 ENCOUNTER — Other Ambulatory Visit (INDEPENDENT_AMBULATORY_CARE_PROVIDER_SITE_OTHER): Payer: Self-pay

## 2015-10-17 DIAGNOSIS — Z23 Encounter for immunization: Secondary | ICD-10-CM

## 2015-10-17 DIAGNOSIS — B20 Human immunodeficiency virus [HIV] disease: Secondary | ICD-10-CM

## 2015-10-17 LAB — CBC WITH DIFFERENTIAL/PLATELET
BASOS ABS: 0 10*3/uL (ref 0.0–0.1)
Basophils Relative: 0 % (ref 0–1)
EOS ABS: 0.3 10*3/uL (ref 0.0–0.7)
Eosinophils Relative: 7 % — ABNORMAL HIGH (ref 0–5)
HCT: 48.4 % (ref 39.0–52.0)
Hemoglobin: 15.9 g/dL (ref 13.0–17.0)
LYMPHS ABS: 1.6 10*3/uL (ref 0.7–4.0)
Lymphocytes Relative: 37 % (ref 12–46)
MCH: 26.9 pg (ref 26.0–34.0)
MCHC: 32.9 g/dL (ref 30.0–36.0)
MCV: 82 fL (ref 78.0–100.0)
MPV: 9.9 fL (ref 8.6–12.4)
Monocytes Absolute: 0.3 10*3/uL (ref 0.1–1.0)
Monocytes Relative: 8 % (ref 3–12)
NEUTROS PCT: 48 % (ref 43–77)
Neutro Abs: 2 10*3/uL (ref 1.7–7.7)
PLATELETS: 157 10*3/uL (ref 150–400)
RBC: 5.9 MIL/uL — AB (ref 4.22–5.81)
RDW: 13.9 % (ref 11.5–15.5)
WBC: 4.2 10*3/uL (ref 4.0–10.5)

## 2015-10-17 LAB — COMPLETE METABOLIC PANEL WITH GFR
ALT: 14 U/L (ref 9–46)
AST: 16 U/L (ref 10–40)
Albumin: 4.2 g/dL (ref 3.6–5.1)
Alkaline Phosphatase: 44 U/L (ref 40–115)
BILIRUBIN TOTAL: 0.9 mg/dL (ref 0.2–1.2)
BUN: 13 mg/dL (ref 7–25)
CHLORIDE: 102 mmol/L (ref 98–110)
CO2: 31 mmol/L (ref 20–31)
Calcium: 9.8 mg/dL (ref 8.6–10.3)
Creat: 1.17 mg/dL (ref 0.60–1.35)
GFR, EST NON AFRICAN AMERICAN: 86 mL/min (ref >=60)
Glucose, Bld: 79 mg/dL (ref 65–99)
Potassium: 5 mmol/L (ref 3.5–5.3)
Sodium: 140 mmol/L (ref 135–146)
Total Protein: 7.2 g/dL (ref 6.1–8.1)

## 2015-10-17 NOTE — Progress Notes (Signed)
#  2 Hep B Vaccine

## 2015-10-18 LAB — T-HELPER CELL (CD4) - (RCID CLINIC ONLY)
CD4 % Helper T Cell: 31 % — ABNORMAL LOW (ref 33–55)
CD4 T CELL ABS: 550 /uL (ref 400–2700)

## 2015-10-19 LAB — HIV-1 RNA QUANT-NO REFLEX-BLD
HIV 1 RNA QUANT: 57 {copies}/mL — AB (ref <20)
HIV-1 RNA Quant, Log: 1.76 Log copies/mL — ABNORMAL HIGH (ref <1.30)

## 2015-10-31 ENCOUNTER — Ambulatory Visit (INDEPENDENT_AMBULATORY_CARE_PROVIDER_SITE_OTHER): Payer: Self-pay | Admitting: Infectious Disease

## 2015-10-31 VITALS — BP 143/94 | HR 85 | Temp 98.3°F | Ht 65.0 in | Wt 164.0 lb

## 2015-10-31 DIAGNOSIS — B20 Human immunodeficiency virus [HIV] disease: Secondary | ICD-10-CM

## 2015-10-31 DIAGNOSIS — J4520 Mild intermittent asthma, uncomplicated: Secondary | ICD-10-CM

## 2015-10-31 DIAGNOSIS — J302 Other seasonal allergic rhinitis: Secondary | ICD-10-CM

## 2015-10-31 NOTE — Progress Notes (Signed)
Subjective:    Patient ID: [name], male    DOB: Oct 28, 1986, 29 y.o.   MRN: [id number]  HPI   29 year old [name] with newly diagnosed HIV infection. He had been tested at least yearly for multiple years last checked in 2015 January. His viral load was not especially high 13,000 range and CD4 count is healthy at 500. His resistance testing shows no evidence of resistance to NNRTI's and RTIs or PI's. He is HLA [id number] negative he does not have viral hepatitis. We opted to go with ODEFSEY and he has suppressed nicely on this regimen.  Past Medical History  Diagnosis Date  . Allergy   . Asthma   . HIV disease 09/19/2015    No past surgical history on file.  Family History  Problem Relation Age of Onset  . Hypertension Mother       reports that he has never smoked. He has never used smokeless tobacco. He reports that he does not drink alcohol or use illicit drugs.   Family History  Problem Relation Age of Onset  . Hypertension Mother     No Known Allergies   Current outpatient prescriptions:  .  albuterol (PROVENTIL HFA;VENTOLIN HFA) 108 (90 BASE) MCG/ACT inhaler, Inhale 2 puffs into the lungs every 4 (four) hours as needed for wheezing or shortness of breath., Disp: 1 Inhaler, Rfl: 3 .  albuterol (PROVENTIL) (2.5 MG/3ML) 0.083% nebulizer solution, Take 3 mLs (2.5 mg total) by nebulization every 4 (four) hours., Disp: 75 mL, Rfl: 12 .  emtricitabine-rilpivir-tenofovir AF (ODEFSEY) 200-25-25 MG TABS per tablet, Take 1 tablet by mouth daily with breakfast., Disp: 30 tablet, Rfl: 11 .  fluticasone (FLOVENT HFA) 44 MCG/ACT inhaler, Inhale 1 puff into the lungs 2 (two) times daily., Disp: 1 Inhaler, Rfl: 11 .  loratadine (CLARITIN) 10 MG tablet, Take 10 mg by mouth daily., Disp: , Rfl:  .  Spacer/Aero-Holding Chambers (AEROCHAMBER PLUS WITH MASK) inhaler, With albuterol inhaler, Disp: 1 each, Rfl: 2   Review of Systems  Constitutional: Negative for fever,  chills, diaphoresis, activity change, appetite change, fatigue and unexpected weight change.  HENT: Negative for congestion, rhinorrhea, sinus pressure, sneezing, sore throat and trouble swallowing.   Eyes: Negative for photophobia and visual disturbance.  Respiratory: Negative for cough, chest tightness, shortness of breath, wheezing and stridor.   Cardiovascular: Negative for chest pain, palpitations and leg swelling.  Gastrointestinal: Negative for nausea, vomiting, abdominal pain, diarrhea, constipation, blood in stool, abdominal distention and anal bleeding.  Genitourinary: Negative for dysuria, hematuria, flank pain and difficulty urinating.  Musculoskeletal: Negative for myalgias, back pain, joint swelling, arthralgias and gait problem.  Skin: Negative for color change, pallor, rash and wound.  Neurological: Negative for dizziness, tremors, weakness and light-headedness.  Hematological: Negative for adenopathy. Does not bruise/bleed easily.  Psychiatric/Behavioral: Negative for behavioral problems, confusion, sleep disturbance, dysphoric mood, decreased concentration and agitation.       Objective:   Physical Exam  Constitutional: He is oriented to person, place, and time. He appears well-developed and well-nourished.  HENT:  Head: Normocephalic and atraumatic.  Eyes: Conjunctivae and EOM are normal.  Neck: Normal range of motion. Neck supple.  Cardiovascular: Normal rate and regular rhythm.   Pulmonary/Chest: Effort normal. No respiratory distress. He has no wheezes.  Abdominal: Soft. He exhibits no distension.  Musculoskeletal: Normal range of motion. He exhibits no edema or tenderness.  Neurological: He is alert and oriented to person, place, and time.  Skin:  Skin is warm and dry. No rash noted. No erythema. No pallor.  Psychiatric: He has a normal mood and affect. His behavior is normal. Judgment and thought content normal.          Assessment & Plan:  HIV disease:  continue ODEFSEY with 400 calorie meal. He met with Audelia Hives today as well as Wes I believe  Asthma: has seasonal variance to it: he does take inhaled steroid when it is flaring along with rescue inhaler.  I spent greater than 25 minutes with the patient including greater than 50% of time in face to face counsel of the patient re his HIV, new ARV regimen and in coordination of his care.

## 2015-11-02 NOTE — Progress Notes (Signed)
[facility] notified via fax. Mehak Roskelley M, RN   

## 2015-12-27 ENCOUNTER — Other Ambulatory Visit: Payer: Self-pay

## 2015-12-27 DIAGNOSIS — B20 Human immunodeficiency virus [HIV] disease: Secondary | ICD-10-CM

## 2015-12-27 LAB — CBC WITH DIFFERENTIAL/PLATELET
Basophils Absolute: 0 10*3/uL (ref 0.0–0.1)
Basophils Relative: 0 % (ref 0–1)
Eosinophils Absolute: 0.3 10*3/uL (ref 0.0–0.7)
Eosinophils Relative: 5 % (ref 0–5)
HEMATOCRIT: 46 % (ref 39.0–52.0)
HEMOGLOBIN: 15.3 g/dL (ref 13.0–17.0)
LYMPHS ABS: 1.8 10*3/uL (ref 0.7–4.0)
Lymphocytes Relative: 27 % (ref 12–46)
MCH: 26.8 pg (ref 26.0–34.0)
MCHC: 33.3 g/dL (ref 30.0–36.0)
MCV: 80.7 fL (ref 78.0–100.0)
MONO ABS: 0.6 10*3/uL (ref 0.1–1.0)
MONOS PCT: 9 % (ref 3–12)
MPV: 10.1 fL (ref 8.6–12.4)
NEUTROS ABS: 4 10*3/uL (ref 1.7–7.7)
NEUTROS PCT: 59 % (ref 43–77)
Platelets: 131 10*3/uL — ABNORMAL LOW (ref 150–400)
RBC: 5.7 MIL/uL (ref 4.22–5.81)
RDW: 15.9 % — ABNORMAL HIGH (ref 11.5–15.5)
WBC: 6.7 10*3/uL (ref 4.0–10.5)

## 2015-12-27 LAB — COMPLETE METABOLIC PANEL WITH GFR
ALBUMIN: 4.5 g/dL (ref 3.6–5.1)
ALK PHOS: 41 U/L (ref 40–115)
ALT: 17 U/L (ref 9–46)
AST: 19 U/L (ref 10–40)
BILIRUBIN TOTAL: 1.3 mg/dL — AB (ref 0.2–1.2)
BUN: 12 mg/dL (ref 7–25)
CALCIUM: 9.2 mg/dL (ref 8.6–10.3)
CO2: 31 mmol/L (ref 20–31)
Chloride: 102 mmol/L (ref 98–110)
Creat: 1.08 mg/dL (ref 0.60–1.35)
GLUCOSE: 108 mg/dL — AB (ref 65–99)
Potassium: 4.2 mmol/L (ref 3.5–5.3)
Sodium: 140 mmol/L (ref 135–146)
TOTAL PROTEIN: 7.2 g/dL (ref 6.1–8.1)

## 2015-12-28 LAB — HIV-1 RNA QUANT-NO REFLEX-BLD
HIV 1 RNA Quant: <20 copies/mL (ref <20)
HIV-1 RNA Quant, Log: <1.3 Log copies/mL (ref <1.30)

## 2015-12-28 LAB — T-HELPER CELL (CD4) - (RCID CLINIC ONLY)
CD4 % Helper T Cell: 31 % — ABNORMAL LOW (ref 33–55)
CD4 T Cell Abs: 540 /uL (ref 400–2700)

## 2015-12-29 LAB — RPR

## 2016-01-09 ENCOUNTER — Ambulatory Visit (INDEPENDENT_AMBULATORY_CARE_PROVIDER_SITE_OTHER): Payer: Self-pay | Admitting: Infectious Disease

## 2016-01-09 ENCOUNTER — Encounter: Payer: Self-pay | Admitting: Infectious Disease

## 2016-01-09 VITALS — BP 148/89 | HR 93 | Temp 97.7°F | Wt 162.0 lb

## 2016-01-09 DIAGNOSIS — B20 Human immunodeficiency virus [HIV] disease: Secondary | ICD-10-CM

## 2016-01-09 DIAGNOSIS — J4520 Mild intermittent asthma, uncomplicated: Secondary | ICD-10-CM

## 2016-01-09 MED ORDER — EMTRICITAB-RILPIVIR-TENOFOV AF 200-25-25 MG PO TABS: 1.0000 | ORAL_TABLET | Freq: Every day | ORAL | Status: DC

## 2016-01-09 NOTE — Progress Notes (Signed)
Subjective:  Chief complaint: followup for HIV on meds   Patient ID: [name], male    DOB: [birth year], 30 y.o.   MRN: [id number]  HPI   30 year old [name] with newly diagnosed HIV infection. He had been tested at least yearly for multiple years last checked in 2015 January. His viral load was not especially high 13,000 range and CD4 count is healthy at 500. His resistance testing shows no evidence of resistance to NNRTI's and RTIs or PI's. He is HLA [id number] negative he does not have viral hepatitis. We opted to go with ODEFSEY and he has suppressed nicely on this regimen.  Past Medical History  Diagnosis Date  . Allergy   . Asthma   . HIV disease ([place]) 09/19/2015    No past surgical history on file.  Family History  Problem Relation Age of Onset  . Hypertension Mother       reports that he has never smoked. He has never used smokeless tobacco. He reports that he does not drink alcohol or use illicit drugs.   Family History  Problem Relation Age of Onset  . Hypertension Mother     No Known Allergies   Current outpatient prescriptions:  .  albuterol (PROVENTIL HFA;VENTOLIN HFA) 108 (90 BASE) MCG/ACT inhaler, Inhale 2 puffs into the lungs every 4 (four) hours as needed for wheezing or shortness of breath., Disp: 1 Inhaler, Rfl: 3 .  albuterol (PROVENTIL) (2.5 MG/3ML) 0.083% nebulizer solution, Take 3 mLs (2.5 mg total) by nebulization every 4 (four) hours., Disp: 75 mL, Rfl: 12 .  emtricitabine-rilpivir-tenofovir AF (ODEFSEY) 200-25-25 MG TABS tablet, Take 1 tablet by mouth daily with breakfast., Disp: 30 tablet, Rfl: 11 .  fluticasone (FLOVENT HFA) 44 MCG/ACT inhaler, Inhale 1 puff into the lungs 2 (two) times daily., Disp: 1 Inhaler, Rfl: 11 .  loratadine (CLARITIN) 10 MG tablet, Take 10 mg by mouth daily., Disp: , Rfl:  .  Spacer/Aero-Holding Chambers (AEROCHAMBER PLUS WITH MASK) inhaler, With albuterol inhaler, Disp: 1 each, Rfl: 2   Review of  Systems  Constitutional: Negative for fever, chills, diaphoresis, activity change, appetite change, fatigue and unexpected weight change.  HENT: Negative for congestion, rhinorrhea, sinus pressure, sneezing, sore throat and trouble swallowing.   Eyes: Negative for photophobia and visual disturbance.  Respiratory: Negative for cough, chest tightness, shortness of breath, wheezing and stridor.   Cardiovascular: Negative for chest pain, palpitations and leg swelling.  Gastrointestinal: Negative for nausea, vomiting, abdominal pain, diarrhea, constipation, blood in stool, abdominal distention and anal bleeding.  Genitourinary: Negative for dysuria, hematuria, flank pain and difficulty urinating.  Musculoskeletal: Negative for myalgias, back pain, joint swelling, arthralgias and gait problem.  Skin: Negative for color change, pallor, rash and wound.  Neurological: Negative for dizziness, tremors, weakness and light-headedness.  Hematological: Negative for adenopathy. Does not bruise/bleed easily.  Psychiatric/Behavioral: Negative for behavioral problems, confusion, sleep disturbance, dysphoric mood, decreased concentration and agitation.       Objective:   Physical Exam  Constitutional: He is oriented to person, place, and time. He appears well-developed and well-nourished.  HENT:  Head: Normocephalic and atraumatic.  Eyes: Conjunctivae and EOM are normal.  Neck: Normal range of motion. Neck supple.  Cardiovascular: Normal rate and regular rhythm.   Pulmonary/Chest: Effort normal. No respiratory distress. He has no wheezes.  Abdominal: Soft. He exhibits no distension.  Musculoskeletal: Normal range of motion. He exhibits no edema or tenderness.  Neurological: He is alert and oriented  to person, place, and time.  Skin: Skin is warm and dry. No rash noted. No erythema. No pallor.  Psychiatric: He has a normal mood and affect. His behavior is normal. Judgment and thought content normal.           Assessment & Plan:  HIV disease: continue ODEFSEY with chewed  Meal  Asthma: has seasonal variance to it: he does take inhaled steroid when it is flaring along with rescue inhaler.

## 2016-01-10 ENCOUNTER — Ambulatory Visit: Payer: Self-pay

## 2016-05-02 ENCOUNTER — Emergency Department (HOSPITAL_COMMUNITY)
Admission: EM | Admit: 2016-05-02 | Discharge: 2016-05-02 | Disposition: A | Discharge status: Home / Self Care | Payer: Self-pay | Attending: Emergency Medicine | Admitting: Emergency Medicine

## 2016-05-02 ENCOUNTER — Emergency Department (HOSPITAL_COMMUNITY): Payer: Self-pay

## 2016-05-02 ENCOUNTER — Encounter (HOSPITAL_COMMUNITY): Payer: Self-pay | Admitting: Nurse Practitioner

## 2016-05-02 DIAGNOSIS — Z7951 Long term (current) use of inhaled steroids: Secondary | ICD-10-CM | POA: Insufficient documentation

## 2016-05-02 DIAGNOSIS — J45901 Unspecified asthma with (acute) exacerbation: Secondary | ICD-10-CM | POA: Insufficient documentation

## 2016-05-02 DIAGNOSIS — Z79899 Other long term (current) drug therapy: Secondary | ICD-10-CM | POA: Insufficient documentation

## 2016-05-02 DIAGNOSIS — Z21 Asymptomatic human immunodeficiency virus [HIV] infection status: Secondary | ICD-10-CM | POA: Insufficient documentation

## 2016-05-02 MED ORDER — PREDNISONE 20 MG PO TABS: 40.0000 mg | ORAL_TABLET | Freq: Every day | ORAL | Status: DC

## 2016-05-02 MED ORDER — ALBUTEROL SULFATE HFA 108 (90 BASE) MCG/ACT IN AERS: 1.0000 | INHALATION_SPRAY | Freq: Four times a day (QID) | RESPIRATORY_TRACT | Status: DC | PRN

## 2016-05-02 MED ORDER — ONDANSETRON 4 MG PO TBDP: 4.0000 mg | ORAL_TABLET | Freq: Once | ORAL | Status: DC

## 2016-05-02 MED ORDER — PREDNISONE 20 MG PO TABS
60.0000 mg | ORAL_TABLET | Freq: Once | ORAL | Status: AC
  Administered 2016-05-02: 60 mg via ORAL
  Filled 2016-05-02: qty 3

## 2016-05-02 MED ORDER — IPRATROPIUM-ALBUTEROL 0.5-2.5 (3) MG/3ML IN SOLN
3.0000 mL | Freq: Once | RESPIRATORY_TRACT | Status: AC
  Administered 2016-05-02: 3 mL via RESPIRATORY_TRACT
  Filled 2016-05-02: qty 3

## 2016-05-02 MED ORDER — ALBUTEROL SULFATE (2.5 MG/3ML) 0.083% IN NEBU: 5.0000 mg | INHALATION_SOLUTION | Freq: Once | RESPIRATORY_TRACT | Status: DC

## 2016-05-02 NOTE — ED Notes (Addendum)
Pt c/o 2 day history of worsening asthma especially at night. Onset after he had URI symptoms. He is without insurance now and does not have any asthma medications at home. He is alert and breathing easily but does have some cough and wheezing

## 2016-05-02 NOTE — Discharge Instructions (Signed)
Asthma, Adult Asthma is a recurring condition in which the airways tighten and narrow. Asthma can make it difficult to breathe. It can cause coughing, wheezing, and shortness of breath. Asthma episodes, also called asthma attacks, range from minor to life-threatening. Asthma cannot be cured, but medicines and lifestyle changes can help control it. CAUSES Asthma is believed to be caused by inherited (genetic) and environmental factors, but its exact cause is unknown. Asthma may be triggered by allergens, lung infections, or irritants in the air. Asthma triggers are different for each person. Common triggers include:   Animal dander.  Dust mites.  Cockroaches.  Pollen from trees or grass.  Mold.  Smoke.  Air pollutants such as dust, household cleaners, hair sprays, aerosol sprays, paint fumes, strong chemicals, or strong odors.  Cold air, weather changes, and winds (which increase molds and pollens in the air).  Strong emotional expressions such as crying or laughing hard.  Stress.  Certain medicines (such as aspirin) or types of drugs (such as beta-blockers).  Sulfites in foods and drinks. Foods and drinks that may contain sulfites include dried fruit, potato chips, and sparkling grape juice.  Infections or inflammatory conditions such as the flu, a cold, or an inflammation of the nasal membranes (rhinitis).  Gastroesophageal reflux disease (GERD).  Exercise or strenuous activity. SYMPTOMS Symptoms may occur immediately after asthma is triggered or many hours later. Symptoms include:  Wheezing.  Excessive nighttime or early morning coughing.  Frequent or severe coughing with a common cold.  Chest tightness.  Shortness of breath. DIAGNOSIS  The diagnosis of asthma is made by a review of your medical history and a physical exam. Tests may also be performed. These may include:  Lung function studies. These tests show how much air you breathe in and out.  Allergy  tests.  Imaging tests such as X-rays. TREATMENT  Asthma cannot be cured, but it can usually be controlled. Treatment involves identifying and avoiding your asthma triggers. It also involves medicines. There are 2 classes of medicine used for asthma treatment:   Controller medicines. These prevent asthma symptoms from occurring. They are usually taken every day.  Reliever or rescue medicines. These quickly relieve asthma symptoms. They are used as needed and provide short-term relief. Your health care provider will help you create an asthma action plan. An asthma action plan is a written plan for managing and treating your asthma attacks. It includes a list of your asthma triggers and how they may be avoided. It also includes information on when medicines should be taken and when their dosage should be changed. An action plan may also involve the use of a device called a peak flow meter. A peak flow meter measures how well the lungs are working. It helps you monitor your condition. HOME CARE INSTRUCTIONS   Take medicines only as directed by your health care provider. Speak with your health care provider if you have questions about how or when to take the medicines.  Use a peak flow meter as directed by your health care provider. Record and keep track of readings.  Understand and use the action plan to help minimize or stop an asthma attack without needing to seek medical care.  Control your home environment in the following ways to help prevent asthma attacks:  Do not smoke. Avoid being exposed to secondhand smoke.  Change your heating and air conditioning filter regularly.  Limit your use of fireplaces and wood stoves.  Get rid of pests (such as roaches   and mice) and their droppings.  Throw away plants if you see mold on them.  Clean your floors and dust regularly. Use unscented cleaning products.  Try to have someone else vacuum for you regularly. Stay out of rooms while they are  being vacuumed and for a short while afterward. If you vacuum, use a dust mask from a hardware store, a double-layered or microfilter vacuum cleaner bag, or a vacuum cleaner with a HEPA filter.  Replace carpet with wood, tile, or vinyl flooring. Carpet can trap dander and dust.  Use allergy-proof pillows, mattress covers, and box spring covers.  Wash bed sheets and blankets every week in hot water and dry them in a dryer.  Use blankets that are made of polyester or cotton.  Clean bathrooms and kitchens with bleach. If possible, have someone repaint the walls in these rooms with mold-resistant paint. Keep out of the rooms that are being cleaned and painted.  Wash hands frequently. SEEK MEDICAL CARE IF:   You have wheezing, shortness of breath, or a cough even if taking medicine to prevent attacks.  The colored mucus you cough up (sputum) is thicker than usual.  Your sputum changes from clear or white to yellow, green, gray, or bloody.  You have any problems that may be related to the medicines you are taking (such as a rash, itching, swelling, or trouble breathing).  You are using a reliever medicine more than 2-3 times per week.  Your peak flow is still at 50-79% of your personal best after following your action plan for 1 hour.  You have a fever. SEEK IMMEDIATE MEDICAL CARE IF:   You seem to be getting worse and are unresponsive to treatment during an asthma attack.  You are short of breath even at rest.  You get short of breath when doing very little physical activity.  You have difficulty eating, drinking, or talking due to asthma symptoms.  You develop chest pain.  You develop a fast heartbeat.  You have a bluish color to your lips or fingernails.  You are light-headed, dizzy, or faint.  Your peak flow is less than 50% of your personal best.   This information is not intended to replace advice given to you by your health care provider. Make sure you discuss any  questions you have with your health care provider.   Document Released: 12/10/2005 Document Revised: 08/31/2015 Document Reviewed: 07/09/2013 [employer] Interactive Patient Education 2016 [employer]  

## 2016-05-02 NOTE — ED Notes (Signed)
Patient transported to X-ray 

## 2016-05-02 NOTE — ED Provider Notes (Signed)
CSN: [id number]     Arrival date & time 05/02/16  1223 History  By signing my name below, I, Emmanuella Mensah, attest that this documentation has been prepared under the direction and in the presence of Mio Schellinger, [place]. Electronically Signed: Angelene GiovanniEmmanuella Mensah, ED Scribe. 05/02/2016. 12:49 PM.    Chief Complaint  Patient presents with  . Asthma    The history is provided by the patient. No language interpreter was used.   HPI Comments: [name] is a 30 y.o. male with a hx of HIV and asthma who presents to the Emergency Department complaining of gradually worsening chest tightness and wheezing onset 2 days ago consistent with his asthma flare up. Pt explains that he was recovering from URI symptoms but still has nasal congestion prior to onset. No alleviating factors noted. Pt has not tried any medications PTA, stating that he is currently in between jobs and is unable to afford his albuterol. He notes that he normally uses his albuterol once a day. He denies smoking. He states that his asthma is normally worse with exertion. Pt denies any fever, chills, or n/v. States his HIV is well controlled with undetectable viral load and good CD4 count.   No PCP.    Past Medical History  Diagnosis Date  . Allergy   . Asthma   . HIV disease (HCC) 09/19/2015   History reviewed. No pertinent past surgical history. Family History  Problem Relation Age of Onset  . Hypertension Mother    Social History  Substance Use Topics  . Smoking status: Never Smoker   . Smokeless tobacco: Never Used  . Alcohol Use: No    Review of Systems  Constitutional: Negative for fever and chills.  HENT: Positive for congestion.   Respiratory: Positive for cough, chest tightness and wheezing.   Gastrointestinal: Negative for vomiting and diarrhea.  All other systems reviewed and are negative.     Allergies  Review of patient's allergies indicates no known allergies.  Home Medications   Prior to  Admission medications   Medication Sig Start Date End Date Taking? Authorizing Provider  albuterol (PROVENTIL HFA;VENTOLIN HFA) 108 (90 BASE) MCG/ACT inhaler Inhale 2 puffs into the lungs every 4 (four) hours as needed for wheezing or shortness of breath. 05/10/15   Stephanie Couphristopher M Street, MD  albuterol (PROVENTIL) (2.5 MG/3ML) 0.083% nebulizer solution Take 3 mLs (2.5 mg total) by nebulization every 4 (four) hours. 04/27/15   Bonney AidAlyssa A Haney, MD  emtricitabine-rilpivir-tenofovir AF (ODEFSEY) 200-25-25 MG TABS tablet Take 1 tablet by mouth daily with breakfast. 01/09/16   Randall Hissornelius N Van Dam, MD  fluticasone (FLOVENT HFA) 44 MCG/ACT inhaler Inhale 1 puff into the lungs 2 (two) times daily. 05/10/15   Stephanie Couphristopher M Street, MD  loratadine (CLARITIN) 10 MG tablet Take 10 mg by mouth daily.    Historical Provider, MD  Spacer/Aero-Holding Chambers (AEROCHAMBER PLUS WITH MASK) inhaler With albuterol inhaler 04/27/15   Alyssa A Haney, MD   BP 125/93 mmHg  Pulse 106  Temp(Src) 98.3 F (36.8 C) (Oral)  Resp 18  SpO2 97% Physical Exam  Constitutional: He is oriented to person, place, and time. He appears well-developed and well-nourished.  HENT:  Head: Normocephalic and atraumatic.  Cardiovascular: Normal rate and normal heart sounds.   Pulmonary/Chest: Effort normal. He has wheezes.  Lungs sound decreased throughout with diffuse expiratory and inspiratory wheezes No increase work of breathing, tachypnea, or dyspnea.   Neurological: He is alert and oriented to person, place, and  time.  Skin: Skin is warm and dry.  Psychiatric: He has a normal mood and affect.  Nursing note and vitals reviewed.   ED Course  Procedures (including critical care time) DIAGNOSTIC STUDIES: Oxygen Saturation is 97% on RA, normal by my interpretation.    COORDINATION OF CARE: 12:46 PM- Pt advised of plan for treatment and pt agrees. Pt will receive chest x-ray and nebulizer treatment.    Labs Review Labs Reviewed - No data  to display  Imaging Review Dg Chest 2 View  05/02/2016  CLINICAL DATA:  Cough, asthma EXAM: CHEST  2 VIEW COMPARISON:  04/26/2015 FINDINGS: The heart size and mediastinal contours are within normal limits. Both lungs are clear. The visualized skeletal structures are unremarkable. IMPRESSION: No active cardiopulmonary disease. Electronically Signed   By: Elige Ko   On: 05/02/2016 14:00     Noelle Penner, PA-C has personally reviewed and evaluated these images and lab results as part of her medical decision-making.  MDM   Final diagnoses:  Asthma exacerbation    Likely asthma exacerbation 2/2 URI. No evidence of pneumonia or other cardiopulmonary pathology on CXR. Lung sounds improved with duoneb x 2 and dose of prednisone. No tachypnea, hypoxia. Instructed f/u with pcp. Rx given for few days of prednisone and albuterol. ER return precautions given.   I personally performed the services described in this documentation, which was scribed in my presence. The recorded information has been reviewed and is accurate.   Carlene Coria, PA-C 05/02/16 2041  Loren Racer, MD 05/03/16 [phone]

## 2016-05-03 ENCOUNTER — Other Ambulatory Visit: Payer: Self-pay | Admitting: Student

## 2016-05-03 NOTE — Telephone Encounter (Signed)
I cant see where pt has been seen since 2016, please advise.

## 2016-07-10 ENCOUNTER — Encounter: Payer: Self-pay | Admitting: Family Medicine

## 2016-07-10 ENCOUNTER — Ambulatory Visit (INDEPENDENT_AMBULATORY_CARE_PROVIDER_SITE_OTHER): Payer: No Typology Code available for payment source | Admitting: Family Medicine

## 2016-07-10 VITALS — BP 122/73 | HR 63 | Temp 98.4°F | Resp 14 | Ht 65.0 in | Wt 157.0 lb

## 2016-07-10 DIAGNOSIS — J45909 Unspecified asthma, uncomplicated: Secondary | ICD-10-CM

## 2016-07-10 MED ORDER — TIOTROPIUM BROMIDE MONOHYDRATE 18 MCG IN CAPS: 18.0000 ug | ORAL_CAPSULE | Freq: Every day | RESPIRATORY_TRACT | Status: DC

## 2016-07-10 MED ORDER — LORATADINE 10 MG PO TABS: 10.0000 mg | ORAL_TABLET | Freq: Every day | ORAL | Status: DC

## 2016-07-10 MED ORDER — ALBUTEROL SULFATE HFA 108 (90 BASE) MCG/ACT IN AERS: 1.0000 | INHALATION_SPRAY | Freq: Four times a day (QID) | RESPIRATORY_TRACT | Status: DC | PRN

## 2016-07-10 MED FILL — SPIRIVA 18 MCG CP-HANDIHALE: 18 | 30 days supply | Qty: 30 | Fill #0

## 2016-07-10 NOTE — Progress Notes (Signed)
Patient ID: Samuel GarnetRaymond Carr Maynard, male   DOB: 11-23-1986, 30 y.o.   MRN: [id number]   [name], is a 30 y.o. male  YQM:[id number]  BMW:[id number]  DOB - 11-23-1986  CC:  Chief Complaint  Patient presents with  . Asthma  . Establish Care       HPI: Samuel ParesRaymond Maynard is a 30 y.o. male here to establish care. [facility] has [facility] He has asthma which has been bothering him more lately. He was referred her by ED to establish primary care and receive treatment for his asthma. He reports using is albuterol inhaler daily, often several times a day. He has been on Spiriva in the past which adequately control his asthma symptoms. He also reports decreased hearing in his right ear. He takes claritin for allergy symptoms.  No Known Allergies Past Medical History  Diagnosis Date  . Allergy   . Asthma   . HIV disease (HCC) 09/19/2015   Current Outpatient Prescriptions on File Prior to Visit  Medication Sig Dispense Refill  . emtricitabine-rilpivir-tenofovir AF (ODEFSEY) 200-25-25 MG TABS tablet Take 1 tablet by mouth daily with breakfast. 30 tablet 11  . fluticasone (FLOVENT HFA) 44 MCG/ACT inhaler Inhale 1 puff into the lungs 2 (two) times daily. 1 Inhaler 11  . predniSONE (DELTASONE) 20 MG tablet Take 2 tablets (40 mg total) by mouth daily. (Patient not taking: Reported on 07/10/2016) 6 tablet 0  . Spacer/Aero-Holding Chambers (AEROCHAMBER PLUS WITH MASK) inhaler With albuterol inhaler (Patient not taking: Reported on 07/10/2016) 1 each 2   No current facility-administered medications on file prior to visit.   Family History  Problem Relation Age of Onset  . Hypertension Mother    Social History   Social History  . Marital Status: Single    Spouse Name: N/A  . Number of Children: N/A  . Years of Education: N/A   Occupational History  . Not on file.   Social History Main Topics  . Smoking status: Never Smoker   . Smokeless tobacco: Never Used  . Alcohol Use: No   . Drug Use: No  . Sexual Activity:    Partners: Male    Birth Control/ Protection: Condom   Other Topics Concern  . Not on file   Social History Narrative   Social history: Has sex with both men and women. Almost always uses a condom. Has had sexual contact with a man who has syphilis however he was using protection.   His family notes that he is out and are okay with it.  He is relieved about that.    Review of Systems: Constitutional: Negative for fever, chills, appetite change, weight loss,  Fatigue. Skin: Negative for rashes or lesions of concern. HENT: Negative for ear pain, ear discharge.nose bleeds. Decrease hearing on right.Some allergy symptoms Eyes: Negative for pain, discharge, redness, itching and visual disturbance. Neck: Negative for pain, stiffness Respiratory: Positive for cough, shortness of breath, wheezing daily, worse at night  Cardiovascular: Negative for chest pain, palpitations and leg swelling. Gastrointestinal: Negative for abdominal pain, nausea, vomiting, diarrhea, constipations Genitourinary: Negative for dysuria, urgency, frequency, hematuria,  Musculoskeletal: Negative for back pain, joint pain, joint  swelling, and gait problem.Negative for weakness. Neurological: Negative for dizziness, tremors, seizures, syncope,   light-headedness, numbness and headaches.  Hematological: Negative for easy bruising or bleeding Psychiatric/Behavioral: Negative for depression, anxiety, decreased concentration, confusion   Objective:   Filed Vitals:   07/10/16 0945  BP: 122/73  Pulse: 63  Temp: 98.4 F (36.9 C)  Resp: 14    Physical Exam: Constitutional: Patient appears well-developed and well-nourished. No distress. HENT: Normocephalic, atraumatic, Oropharynx is clear and moist. Right ear canal is occluded by wax. Eyes: Conjunctivae and EOM are normal. PERRLA, no scleral icterus. Neck: Normal ROM. Neck supple. No lymphadenopathy, No thyromegaly. CVS: RRR,  S1/S2 +, no murmurs, no gallops, no rubs Pulmonary: Effort and breath sounds normal, no stridor, rhonchi, wheezes, rales.  Abdominal: Soft. Normoactive BS,, no distension, tenderness, rebound or guarding.  Musculoskeletal: Normal range of motion. No edema and no tenderness.  Neuro: Alert.Normal muscle tone coordination. Non-focal Skin: Skin is warm and dry. No rash noted. Not diaphoretic. No erythema. No pallor. Psychiatric: Normal mood and affect. Behavior, judgment, thought content normal.  Lab Results  Component Value Date   WBC 6.7 12/27/2015   HGB 15.3 12/27/2015   HCT 46.0 12/27/2015   MCV 80.7 12/27/2015   PLT 131* 12/27/2015   Lab Results  Component Value Date   CREATININE 1.08 12/27/2015   BUN 12 12/27/2015   NA 140 12/27/2015   K 4.2 12/27/2015   CL 102 12/27/2015   CO2 31 12/27/2015    No results found for: HGBA1C Lipid Panel     Component Value Date/Time   CHOL 180 08/25/2015 1407   TRIG 60 08/25/2015 1407   HDL 42 08/25/2015 1407   CHOLHDL 4.3 08/25/2015 1407   VLDL 12 08/25/2015 1407   LDLCALC 126 08/25/2015 1407       Assessment and plan:   1. Asthma, unspecified asthma severity, uncomplicated  - albuterol (PROVENTIL HFA;VENTOLIN HFA) 108 (90 Base) MCG/ACT inhaler; Inhale 1-2 puffs into the lungs every 6 (six) hours as needed for wheezing or shortness of breath.  Dispense: 1 Inhaler; Refill: 0 - loratadine (CLARITIN) 10 MG tablet; Take 1 tablet (10 mg total) by mouth daily.  Dispense: 30 tablet; Refill: 11 - tiotropium (SPIRIVA HANDIHALER) 18 MCG inhalation capsule; Place 1 capsule (18 mcg total) into inhaler and inhale daily.  Dispense: 30 capsule; Refill: 12  2. HIV - He has an appointment coming up in August. Will defer labs to be done there.   Return in about 3 months (around 10/10/2016).  The patient was given clear instructions to go to ER or return to medical center if symptoms don't improve, worsen or new problems develop. The patient  verbalized understanding.    Henrietta Hoover FNP  07/10/2016, 10:10 AM

## 2016-07-10 NOTE — Patient Instructions (Signed)
Let me know if you are unable to get the Spiriva and we will try something else. Follow up with me in 3 months.

## 2016-07-11 ENCOUNTER — Other Ambulatory Visit: Payer: Self-pay

## 2016-07-11 ENCOUNTER — Ambulatory Visit: Payer: No Typology Code available for payment source

## 2016-07-11 DIAGNOSIS — J45909 Unspecified asthma, uncomplicated: Secondary | ICD-10-CM

## 2016-07-11 MED ORDER — ALBUTEROL SULFATE HFA 108 (90 BASE) MCG/ACT IN AERS: 1.0000 | INHALATION_SPRAY | Freq: Four times a day (QID) | RESPIRATORY_TRACT | Status: DC | PRN

## 2016-07-11 MED FILL — !VENTOLIN HFA INHALER: 108 (90 BAS | 20 days supply | Qty: 1 | Fill #0

## 2016-07-11 NOTE — Telephone Encounter (Signed)
Refill for albuterol sent into pharmacy. Thanks!  

## 2016-07-13 ENCOUNTER — Encounter: Payer: Self-pay | Admitting: Infectious Disease

## 2016-07-16 ENCOUNTER — Encounter (HOSPITAL_COMMUNITY): Payer: Self-pay | Admitting: Emergency Medicine

## 2016-07-16 ENCOUNTER — Emergency Department (HOSPITAL_COMMUNITY)
Admission: EM | Admit: 2016-07-16 | Discharge: 2016-07-16 | Disposition: A | Discharge status: Home / Self Care | Payer: No Typology Code available for payment source | Attending: Emergency Medicine | Admitting: Emergency Medicine

## 2016-07-16 DIAGNOSIS — J45901 Unspecified asthma with (acute) exacerbation: Secondary | ICD-10-CM

## 2016-07-16 DIAGNOSIS — J45909 Unspecified asthma, uncomplicated: Secondary | ICD-10-CM | POA: Insufficient documentation

## 2016-07-16 MED ORDER — ALBUTEROL SULFATE HFA 108 (90 BASE) MCG/ACT IN AERS
2.0000 | INHALATION_SPRAY | Freq: Once | RESPIRATORY_TRACT | Status: AC
  Administered 2016-07-16: 2 via RESPIRATORY_TRACT
  Filled 2016-07-16: qty 6.7

## 2016-07-16 MED ORDER — IPRATROPIUM-ALBUTEROL 0.5-2.5 (3) MG/3ML IN SOLN
3.0000 mL | Freq: Once | RESPIRATORY_TRACT | Status: AC
  Administered 2016-07-16: 3 mL via RESPIRATORY_TRACT
  Filled 2016-07-16: qty 3

## 2016-07-16 NOTE — ED Triage Notes (Signed)
Patient reports wheezing with occasional dry cough onset yesterday , denies fever .

## 2016-07-16 NOTE — Discharge Instructions (Signed)
Read the information below.  You may return to the Emergency Department at any time for worsening condition or any new symptoms that concern you.   If you develop worsening shortness of breath, uncontrolled wheezing, severe chest pain, or fevers despite using tylenol and/or ibuprofen, return for a recheck.     °

## 2016-07-16 NOTE — ED Provider Notes (Signed)
[facility]-EMERGENCY DEPT Provider Note   CSN: [id number] Arrival date & time: 07/16/16  7322  First Provider Contact:  First MD Initiated Contact with Patient 07/16/16 [phone]        History   Chief Complaint Chief Complaint  Patient presents with  . Asthma    HPI [name] is a 30 y.o. male.  The history is provided by the patient.     Patient with hx asthma, HIV (last CD4 count on record was 500) p/w wheezing and dry cough that began yesterday, feels like his typical asthma exacerbation.  He was at a cookout yesterday all day and was around a lot of grill and cigarette smoke, and is also out of his albuterol inhaler.  Denies fevers, chills, myalgias, other URI symptoms, chest pain.    Past Medical History:  Diagnosis Date  . Allergy   . Asthma   . HIV disease (HCC) 09/19/2015    Patient Active Problem List   Diagnosis Date Noted  . HIV disease (HCC) 09/19/2015  . Asthma 04/27/2015  . HPV (human papilloma virus) anogenital infection 01/09/2013  . Seasonal allergies 04/18/2012  . Exposure to STD 04/18/2012  . Family history of diabetes mellitus 04/18/2012  . ACNE, MILD 08/10/2008    History reviewed. No pertinent surgical history.     Home Medications    Prior to Admission medications   Medication Sig Start Date End Date Taking? Authorizing Provider  albuterol (PROVENTIL HFA;VENTOLIN HFA) 108 (90 Base) MCG/ACT inhaler Inhale 1-2 puffs into the lungs every 6 (six) hours as needed for wheezing or shortness of breath. 07/11/16   Henrietta Hoover, NP  emtricitabine-rilpivir-tenofovir AF (ODEFSEY) 200-25-25 MG TABS tablet Take 1 tablet by mouth daily with breakfast. 01/09/16   [name], MD  fluticasone [facility] HFA) 44 MCG/ACT inhaler Inhale 1 puff into the lungs 2 (two) times daily. 05/10/15   Stephanie Coup Street, MD  loratadine (CLARITIN) 10 MG tablet Take 1 tablet (10 mg total) by mouth daily. 07/10/16   Henrietta Hoover, NP  predniSONE (DELTASONE) 20  MG tablet Take 2 tablets (40 mg total) by mouth daily. Patient not taking: Reported on 07/10/2016 05/02/16   Ace Gins Sam, PA-C  Spacer/Aero-Holding Chambers (AEROCHAMBER PLUS WITH MASK) inhaler With albuterol inhaler Patient not taking: Reported on 07/10/2016 04/27/15   Bonney Aid, MD  tiotropium (SPIRIVA HANDIHALER) 18 MCG inhalation capsule Place 1 capsule (18 mcg total) into inhaler and inhale daily. 07/10/16   Henrietta Hoover, NP    Family History Family History  Problem Relation Age of Onset  . Hypertension Mother     Social History Social History  Substance Use Topics  . Smoking status: Never Smoker  . Smokeless tobacco: Never Used  . Alcohol use No     Allergies   Review of patient's allergies indicates no known allergies.   Review of Systems Review of Systems  Constitutional: Negative for activity change, appetite change, chills and fever.  HENT: Negative for congestion, sore throat and trouble swallowing.   Respiratory: Positive for shortness of breath and wheezing.   Cardiovascular: Negative for chest pain.  Gastrointestinal: Negative for abdominal pain, constipation, diarrhea, nausea and vomiting.  Musculoskeletal: Negative for myalgias.  Skin: Negative for rash.     Physical Exam Updated Vital Signs BP 142/86 (BP Location: Right Arm)   Pulse 83   Temp 98.4 F (36.9 C) (Oral)   Resp 16   Ht 5\' 5"  (1.651 m)   Wt  69.9 kg   SpO2 96%   BMI 25.63 kg/m   Physical Exam  Constitutional: He appears well-developed and well-nourished. No distress.  HENT:  Head: Normocephalic and atraumatic.  Neck: Neck supple.  Pulmonary/Chest: He has decreased breath sounds. He has wheezes. He has no rhonchi. He has no rales.  Diffuse wheezing and tightness, decreased breath sounds throughout.  Increased work of breathing.    Neurological: He is alert.  Skin: He is not diaphoretic.  Nursing note and vitals reviewed.    ED Treatments / Results  Labs (all labs ordered  are listed, but only abnormal results are displayed) Labs Reviewed - No data to display  EKG  EKG Interpretation None       Radiology No results found.  Procedures Procedures (including critical care time)  Medications Ordered in ED Medications  ipratropium-albuterol (DUONEB) 0.5-2.5 (3) MG/3ML nebulizer solution 3 mL (3 mLs Nebulization Given 07/16/16 0654)  albuterol (PROVENTIL HFA;VENTOLIN HFA) 108 (90 Base) MCG/ACT inhaler 2 puff (2 puffs Inhalation Given 07/16/16 0815)     Initial Impression / Assessment and Plan / ED Course  I have reviewed the triage vital signs and the nursing notes.  Pertinent labs & imaging results that were available during my care of the patient were reviewed by me and considered in my medical decision making (see chart for details).  Clinical Course    Afebrile, nontoxic patient with hx asthma, out of his albuterol medication and exposed to smoke yesterday, with wheezing, cough, feels like typical asthma symptoms.  Doubt PNA, PE, pneumothorax, cardiac etiology.  Nebs given with improvement back to baseline after single treatment.  D/C home with albuterol hfa, PCP follow up.  Discussed result, findings, treatment, and follow up  with patient.  Pt given return precautions.  Pt verbalizes understanding and agrees with plan.       Final Clinical Impressions(s) / ED Diagnoses   Final diagnoses:  Asthma attack    New Prescriptions Discharge Medication List as of 07/16/2016  8:05 AM       Trixie Dredge, PA-C 07/16/16 1246    Shon Baton, MD 07/16/16 2302

## 2016-07-27 ENCOUNTER — Ambulatory Visit: Payer: Self-pay | Admitting: Family Medicine

## 2016-07-30 MED FILL — VENTOLIN HFA 90 MCG INHALER: 108 (90 BAS | 28 days supply | Qty: 18 | Fill #0

## 2016-08-04 ENCOUNTER — Emergency Department (HOSPITAL_COMMUNITY)
Admission: EM | Admit: 2016-08-04 | Discharge: 2016-08-04 | Disposition: A | Discharge status: Home / Self Care | Payer: No Typology Code available for payment source | Attending: Emergency Medicine | Admitting: Emergency Medicine

## 2016-08-04 DIAGNOSIS — J45909 Unspecified asthma, uncomplicated: Secondary | ICD-10-CM | POA: Insufficient documentation

## 2016-08-04 DIAGNOSIS — L0390 Cellulitis, unspecified: Secondary | ICD-10-CM

## 2016-08-04 DIAGNOSIS — L0291 Cutaneous abscess, unspecified: Secondary | ICD-10-CM

## 2016-08-04 DIAGNOSIS — L02416 Cutaneous abscess of left lower limb: Secondary | ICD-10-CM | POA: Insufficient documentation

## 2016-08-04 MED ORDER — LIDOCAINE HCL 2 % IJ SOLN
20.0000 mL | Freq: Once | INTRAMUSCULAR | Status: AC
  Administered 2016-08-04: 400 mg
  Filled 2016-08-04: qty 20

## 2016-08-04 MED ORDER — DOXYCYCLINE HYCLATE 100 MG PO CAPS: 100.0000 mg | ORAL_CAPSULE | Freq: Two times a day (BID) | ORAL | 0 refills | Status: DC

## 2016-08-04 NOTE — Discharge Instructions (Signed)
Use warm compresses around the area.  Return here in 2 days for recheck and packing removal.

## 2016-08-04 NOTE — ED Notes (Signed)
Declined W/C at D/C and was escorted to lobby by RN. 

## 2016-08-04 NOTE — ED Triage Notes (Signed)
patient here with abscess to left inner thigh x 3 days, complains of pain, no drainage. Hx of same

## 2016-08-04 NOTE — ED Provider Notes (Signed)
[facility]-EM[facility]Y DEPT Provider Note   CSN: [id number] Arrival date & time: 08/04/16  [id number]  First Provider Contact:  First MD Initiated Contact with Patient 08/04/16 [phone]        History   Chief Complaint Chief Complaint  Patient presents with  . Abscess    HPI [name] is a 30 y.o. male.  HPI Patient presents to the emergency department with an abscess to the area just to the left of the base of the scrotum on the inner thigh region.  The patient states that has been there for the last 4 days.  He states he has had previous episode of this in the past.  Patient, states she has not had any fever, nausea, vomiting, weakness, dizziness, headache, blurred vision, or syncope.  Patient states he did not take any medications prior to arrival Past Medical History:  Diagnosis Date  . Allergy   . Asthma   . HIV disease (HCC) 09/19/2015    Patient Active Problem List   Diagnosis Date Noted  . HIV disease (HCC) 09/19/2015  . Asthma 04/27/2015  . HPV (human papilloma virus) anogenital infection 01/09/2013  . Seasonal allergies 04/18/2012  . Exposure to STD 04/18/2012  . Family history of diabetes mellitus 04/18/2012  . ACNE, MILD 08/10/2008    No past surgical history on file.     Home Medications    Prior to Admission medications   Medication Sig Start Date End Date Taking? Authorizing Provider  albuterol (PROVENTIL HFA;VENTOLIN HFA) 108 (90 Base) MCG/ACT inhaler Inhale 1-2 puffs into the lungs every 6 (six) hours as needed for wheezing or shortness of breath. 07/11/16   Henrietta HooverLinda C Bernhardt, NP  emtricitabine-rilpivir-tenofovir AF (ODEFSEY) 200-25-25 MG TABS tablet Take 1 tablet by mouth daily with breakfast. 01/09/16   Randall Hissornelius N Van Dam, MD  loratadine (CLARITIN) 10 MG tablet Take 1 tablet (10 mg total) by mouth daily. 07/10/16   Henrietta HooverLinda C Bernhardt, NP  tiotropium (SPIRIVA HANDIHALER) 18 MCG inhalation capsule Place 1 capsule (18 mcg total) into inhaler and inhale daily.  07/10/16   Henrietta HooverLinda C Bernhardt, NP    Family History Family History  Problem Relation Age of Onset  . Hypertension Mother     Social History Social History  Substance Use Topics  . Smoking status: Never Smoker  . Smokeless tobacco: Never Used  . Alcohol use No     Allergies   Review of patient's allergies indicates no known allergies.   Review of Systems Review of Systems  All other systems negative except as documented in the HPI. All pertinent positives and negatives as reviewed in the HPI. Physical Exam Updated Vital Signs BP 162/86 (BP Location: Left Arm)   Pulse 87   Temp 98.5 F (36.9 C) (Oral)   Resp 18   Wt 71.8 kg   SpO2 100%   BMI 26.32 kg/m   Physical Exam  Constitutional: He is oriented to person, place, and time. He appears well-developed and well-nourished. No distress.  Pulmonary/Chest: Effort normal.  Genitourinary:     Neurological: He is alert and oriented to person, place, and time.     ED Treatments / Results  Labs (all labs ordered are listed, but only abnormal results are displayed) Labs Reviewed - No data to display  EKG  EKG Interpretation None       Radiology No results found.  Procedures Procedures (including critical care time)  Medications Ordered in ED Medications  lidocaine (XYLOCAINE) 2 % (with pres) injection  400 mg (400 mg Infiltration Given 08/04/16 0907)     Initial Impression / Assessment and Plan / ED Course  I have reviewed the triage vital signs and the nursing notes.  Pertinent labs & imaging results that were available during my care of the patient were reviewed by me and considered in my medical decision making (see chart for details).  Clinical Course   INCISION AND DRAINAGE Performed by: Carlyle Dolly Consent: Verbal consent obtained. Risks and benefits: risks, benefits and alternatives were discussed Type: abscess  Body area: Inner thigh just to the left of the scrotal  region  Anesthesia: local infiltration  Incision was made with a scalpel.  Local anesthetic: lidocaine 2% wo epinephrine  Anesthetic total: 6 ml  Complexity: complex Blunt dissection to break up loculations  Drainage: purulent  Drainage amount: large  Packing material: 1/4 in iodoform gauze  Patient tolerance: Patient tolerated the procedure well with no immediate complications.   Patient advised to return here in 2 days for a recheck and packing removal.  Final Clinical Impressions(s) / ED Diagnoses   Final diagnoses:  None    New Prescriptions New Prescriptions   No medications on file     Charlestine Night, PA-C 08/04/16 5784    Benjiman Core, MD 08/04/16 [phone]

## 2016-08-06 ENCOUNTER — Emergency Department (HOSPITAL_COMMUNITY)
Admission: EM | Admit: 2016-08-06 | Discharge: 2016-08-06 | Disposition: A | Discharge status: Home / Self Care | Payer: No Typology Code available for payment source | Attending: Emergency Medicine | Admitting: Emergency Medicine

## 2016-08-06 DIAGNOSIS — J45909 Unspecified asthma, uncomplicated: Secondary | ICD-10-CM | POA: Insufficient documentation

## 2016-08-06 DIAGNOSIS — Z4800 Encounter for change or removal of nonsurgical wound dressing: Secondary | ICD-10-CM | POA: Insufficient documentation

## 2016-08-06 DIAGNOSIS — Z09 Encounter for follow-up examination after completed treatment for conditions other than malignant neoplasm: Secondary | ICD-10-CM

## 2016-08-06 NOTE — ED Provider Notes (Signed)
[facility]EM[facility]ENCY DEPT Provider Note   CSN: [id number] Arrival date & time: 08/06/16  [id number]   First MD Initiated Contact with Patient 08/06/16 [phone]    By signing my name below, I, Javier Dockerobert Ryan Halas, attest that this documentation has been prepared under the direction and in the presence of Teressa LowerVrinda Shanaye Rief, CNP. Electronically Signed: Javier Dockerobert Ryan Halas, ER Scribe. 08/04/2016. 4:20 PM.  History   Chief Complaint Chief Complaint  Patient presents with  . Wound Infection   HPI  HPI Comments: [name] is a 30 y.o. male who presents to the Emergency Department reporting for a recheck of an abscess that was I&D two days ago in the ED. His abscess is no longer ot draining. He was put on abx in the ED. The packing came out this morning before he reported for recheck. He is not having increased pain. He has a past hx of abscesses.   Past Medical History:  Diagnosis Date  . Allergy   . Asthma   . HIV disease (HCC) 09/19/2015    Patient Active Problem List   Diagnosis Date Noted  . HIV disease (HCC) 09/19/2015  . Asthma 04/27/2015  . HPV (human papilloma virus) anogenital infection 01/09/2013  . Seasonal allergies 04/18/2012  . Exposure to STD 04/18/2012  . Family history of diabetes mellitus 04/18/2012  . ACNE, MILD 08/10/2008    No past surgical history on file.    Home Medications    Prior to Admission medications   Medication Sig Start Date End Date Taking? Authorizing Provider  albuterol (PROVENTIL HFA;VENTOLIN HFA) 108 (90 Base) MCG/ACT inhaler Inhale 1-2 puffs into the lungs every 6 (six) hours as needed for wheezing or shortness of breath. 07/11/16   Henrietta HooverLinda C Bernhardt, NP  doxycycline (VIBRAMYCIN) 100 MG capsule Take 1 capsule (100 mg total) by mouth 2 (two) times daily. 08/04/16   Charlestine Nighthristopher Lawyer, PA-C  emtricitabine-rilpivir-tenofovir AF (ODEFSEY) 200-25-25 MG TABS tablet Take 1 tablet by mouth daily with breakfast. 01/09/16   Randall Hissornelius N Van Dam, MD  loratadine  (CLARITIN) 10 MG tablet Take 1 tablet (10 mg total) by mouth daily. 07/10/16   Henrietta HooverLinda C Bernhardt, NP  tiotropium (SPIRIVA HANDIHALER) 18 MCG inhalation capsule Place 1 capsule (18 mcg total) into inhaler and inhale daily. 07/10/16   Henrietta HooverLinda C Bernhardt, NP    Family History Family History  Problem Relation Age of Onset  . Hypertension Mother     Social History Social History  Substance Use Topics  . Smoking status: Never Smoker  . Smokeless tobacco: Never Used  . Alcohol use No    Allergies   Review of patient's allergies indicates no known allergies.   Review of Systems Review of Systems  Constitutional: Negative for chills and fever.  Skin: Positive for wound. Negative for color change.  Neurological: Negative for weakness and numbness.   Physical Exam Updated Vital Signs BP 110/79 (BP Location: Right Arm)   Pulse 90   Temp 98.2 F (36.8 C) (Oral)   Resp 19   SpO2 100%   Physical Exam  Constitutional: He is oriented to person, place, and time. He appears well-developed and well-nourished. No distress.  HENT:  Head: Normocephalic and atraumatic.  Eyes: Pupils are equal, round, and reactive to light.  Neck: Neck supple.  Cardiovascular: Normal rate.   Pulmonary/Chest: Effort normal. No respiratory distress.  Musculoskeletal: Normal range of motion.  Neurological: He is alert and oriented to person, place, and time. Coordination normal.  Skin: Skin is warm  and dry. He is not diaphoretic.  No drainage our redness noted to the left groin. Well healing area. No packing noted  Psychiatric: He has a normal mood and affect. His behavior is normal.  Nursing note and vitals reviewed.   ED Treatments / Results  DIAGNOSTIC STUDIES: Oxygen Saturation is 100% on RA, normal by my interpretation.    COORDINATION OF CARE: 9:56 AM Discussed treatment plan with pt at bedside which includes encouraged pt to complete abx regimen and pt agreed to plan.  Labs (all labs ordered are  listed, but only abnormal results are displayed) Labs Reviewed - No data to display  EKG  EKG Interpretation None       Radiology No results found.  Procedures Procedures (including critical care time)  Medications Ordered in ED Medications - No data to display   Initial Impression / Assessment and Plan / ED Course  I have reviewed the triage vital signs and the nursing notes.  Pertinent labs & imaging results that were available during my care of the patient were reviewed by me and considered in my medical decision making (see chart for details).  Clinical Course    Patient returns for check of recent incision and drainage. The region appears to be well-healing and infection appears to be resolving. Patient symptoms improved from prior visit. Afebrile and hemodynamically stable. Pt is instructed to continue with home care or antibiotics. Pt has a good understanding of return precautions and is safe for discharge at this time.   Final Clinical Impressions(s) / ED Diagnoses   Final diagnoses:  None    New Prescriptions New Prescriptions   No medications on file      I personally performed the services described in this documentation, which was scribed in my presence. The recorded information has been reviewed and is accurate.     Teressa LowerVrinda Courtni Balash, NP 08/06/16 1004    Raeford RazorStephen Kohut, MD 08/10/16 1122

## 2016-08-06 NOTE — ED Notes (Signed)
Declined W/C at D/C and was escorted to lobby by RN. 

## 2016-08-06 NOTE — ED Triage Notes (Signed)
Here for f/u on groin wound pt states looks better

## 2016-08-22 ENCOUNTER — Other Ambulatory Visit (HOSPITAL_COMMUNITY)
Admission: RE | Admit: 2016-08-22 | Discharge: 2016-08-22 | Disposition: A | Discharge status: Home / Self Care | Payer: No Typology Code available for payment source | Source: Ambulatory Visit | Attending: Infectious Disease | Admitting: Infectious Disease

## 2016-08-22 ENCOUNTER — Ambulatory Visit (INDEPENDENT_AMBULATORY_CARE_PROVIDER_SITE_OTHER): Payer: No Typology Code available for payment source | Admitting: Infectious Disease

## 2016-08-22 ENCOUNTER — Encounter: Payer: Self-pay | Admitting: Infectious Disease

## 2016-08-22 VITALS — BP 129/84 | HR 67 | Temp 98.4°F | Ht 65.0 in | Wt 161.0 lb

## 2016-08-22 DIAGNOSIS — Z23 Encounter for immunization: Secondary | ICD-10-CM

## 2016-08-22 DIAGNOSIS — Z113 Encounter for screening for infections with a predominantly sexual mode of transmission: Secondary | ICD-10-CM | POA: Insufficient documentation

## 2016-08-22 DIAGNOSIS — J45990 Exercise induced bronchospasm: Secondary | ICD-10-CM

## 2016-08-22 DIAGNOSIS — B20 Human immunodeficiency virus [HIV] disease: Secondary | ICD-10-CM

## 2016-08-22 LAB — COMPLETE METABOLIC PANEL WITH GFR
ALBUMIN: 4.4 g/dL (ref 3.6–5.1)
ALK PHOS: 53 U/L (ref 40–115)
ALT: 13 U/L (ref 9–46)
AST: 12 U/L (ref 10–40)
BILIRUBIN TOTAL: 0.7 mg/dL (ref 0.2–1.2)
BUN: 16 mg/dL (ref 7–25)
CO2: 27 mmol/L (ref 20–31)
Calcium: 9.3 mg/dL (ref 8.6–10.3)
Chloride: 101 mmol/L (ref 98–110)
Creat: 1.27 mg/dL (ref 0.60–1.35)
GFR, EST NON AFRICAN AMERICAN: 77 mL/min (ref >=60)
GFR, Est African American: 89 mL/min (ref >=60)
GLUCOSE: 82 mg/dL (ref 65–99)
Potassium: 4.4 mmol/L (ref 3.5–5.3)
SODIUM: 140 mmol/L (ref 135–146)
Total Protein: 7.2 g/dL (ref 6.1–8.1)

## 2016-08-22 LAB — CBC WITH DIFFERENTIAL/PLATELET
BASOS ABS: 0 {cells}/uL (ref 0–200)
Basophils Relative: 0 %
EOS PCT: 8 %
Eosinophils Absolute: 424 cells/uL (ref 15–500)
HCT: 48.5 % (ref 38.5–50.0)
Hemoglobin: 16.1 g/dL (ref 13.2–17.1)
Lymphocytes Relative: 36 %
Lymphs Abs: 1908 cells/uL (ref 850–3900)
MCH: 27 pg (ref 27.0–33.0)
MCHC: 33.2 g/dL (ref 32.0–36.0)
MCV: 81.4 fL (ref 80.0–100.0)
MONOS PCT: 10 %
MPV: 10.3 fL (ref 7.5–12.5)
Monocytes Absolute: 530 cells/uL (ref 200–950)
NEUTROS ABS: 2438 {cells}/uL (ref 1500–7800)
Neutrophils Relative %: 46 %
PLATELETS: 133 10*3/uL — AB (ref 140–400)
RBC: 5.96 MIL/uL — AB (ref 4.20–5.80)
RDW: 14.8 % (ref 11.0–15.0)
WBC: 5.3 10*3/uL (ref 3.8–10.8)

## 2016-08-22 NOTE — Progress Notes (Signed)
Subjective:  Chief complaint: followup for HIV on meds   Patient ID: [name], male    DOB: 12-20-1986, 30 y.o.   MRN: [id number]  HPI  30 year old [name] with relatively recently diagnosed HIV infection. He had been tested at least yearly for multiple years last checked in 2015 January. His viral load was not especially high 13,000 range and CD4 count is healthy at 500. His resistance testing shows no evidence of resistance to NNRTI's and RTIs or PI's. He is HLA B5701 negative he does not have viral hepatitis. We opted to go with ODEFSEY and he has suppressed nicely on this regimen. 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  Past Medical History:  Diagnosis Date  . Allergy   . Asthma   . HIV disease ([place]) 09/19/2015    No past surgical history on file.  Family History  Problem Relation Age of Onset  . Hypertension Mother       reports that he has never smoked. He has never used smokeless tobacco. He reports that he does not drink alcohol or use drugs.   Family History  Problem Relation Age of Onset  . Hypertension Mother     No Known Allergies   Current Outpatient Prescriptions:  .  albuterol (PROVENTIL HFA;VENTOLIN HFA) 108 (90 Base) MCG/ACT inhaler, Inhale 1-2 puffs into the lungs every 6 (six) hours as needed for wheezing or shortness of breath., Disp: 1 Inhaler, Rfl: 2 .  doxycycline (VIBRAMYCIN) 100 MG capsule, Take 1 capsule (100 mg total) by mouth 2 (two) times daily., Disp: 20 capsule, Rfl: 0 .  emtricitabine-rilpivir-tenofovir AF (ODEFSEY) 200-25-25 MG TABS tablet, Take 1 tablet by mouth daily with breakfast., Disp: 30 tablet, Rfl: 11 .  loratadine (CLARITIN) 10 MG tablet, Take 1 tablet (10 mg total) by mouth daily.,  Disp: 30 tablet, Rfl: 11 .  tiotropium (SPIRIVA HANDIHALER) 18 MCG inhalation capsule, Place 1 capsule (18 mcg total) into inhaler and inhale daily., Disp: 30 capsule, Rfl: 12   Review of Systems  Constitutional: Negative for activity change, appetite change, chills, diaphoresis, fatigue, fever and unexpected weight change.  HENT: Negative for congestion, rhinorrhea, sinus pressure, sneezing, sore throat and trouble swallowing.   Eyes: Negative for photophobia and visual disturbance.  Respiratory: Negative for cough, chest tightness, shortness of breath, wheezing and stridor.   Cardiovascular: Negative for chest pain, palpitations and leg swelling.  Gastrointestinal: Negative for abdominal distention, abdominal pain, anal bleeding, blood in stool, constipation, diarrhea, nausea and vomiting.  Genitourinary: Negative for difficulty urinating, dysuria, flank pain and hematuria.  Musculoskeletal: Negative for arthralgias, back pain, gait problem, joint swelling and myalgias.  Skin: Negative for color change, pallor, rash and wound.  Neurological: Negative for dizziness, tremors, weakness and light-headedness.  Hematological: Negative for adenopathy. Does not bruise/bleed easily.  Psychiatric/Behavioral: Negative for agitation, behavioral problems, confusion, decreased concentration, dysphoric mood and sleep disturbance.       Objective:   Physical Exam  Constitutional: He is oriented to person, place, and time. He appears well-developed and well-nourished.  HENT:  Head: Normocephalic and atraumatic.  Eyes: Conjunctivae and EOM are normal.  Neck: Normal range of motion. Neck supple.  Cardiovascular: Normal rate and regular rhythm.   Pulmonary/Chest: Effort normal.  No respiratory distress. He has no wheezes.  Abdominal: Soft. He exhibits no distension.  Musculoskeletal: Normal range of motion. He exhibits no edema or tenderness.  Neurological: He is alert and oriented to person, place, and  time.  Skin: Skin is warm and dry. No rash noted. No erythema. No pallor.  Psychiatric: He has a normal mood and affect. His behavior is normal. Judgment and thought content normal.          Assessment & Plan:  HIV disease: continue ODEFSEY with chewed  Meal  Asthma: has seasonal variance to it: he does take inhaled steroid why on sprivia

## 2016-08-23 LAB — T-HELPER CELL (CD4) - (RCID CLINIC ONLY)
CD4 T CELL HELPER: 28 % — AB (ref 33–55)
CD4 T Cell Abs: 630 /uL (ref 400–2700)

## 2016-08-23 LAB — RPR

## 2016-08-23 LAB — URINE CYTOLOGY ANCILLARY ONLY
CHLAMYDIA, DNA PROBE: NEGATIVE
Neisseria Gonorrhea: NEGATIVE

## 2016-08-24 LAB — HIV-1 RNA ULTRAQUANT REFLEX TO GENTYP+
HIV 1 RNA QUANT: 25 {copies}/mL — AB (ref <20)
HIV-1 RNA QUANT, LOG: 1.4 {Log_copies}/mL — AB (ref <1.30)

## 2016-08-28 ENCOUNTER — Other Ambulatory Visit: Payer: Self-pay | Admitting: Family Medicine

## 2016-08-28 DIAGNOSIS — J45909 Unspecified asthma, uncomplicated: Secondary | ICD-10-CM

## 2016-08-28 MED FILL — SPIRIVA 18 MCG CP-HANDIHALE: 18 | 30 days supply | Qty: 30 | Fill #1

## 2016-08-28 MED FILL — VENTOLIN HFA 90 MCG INHALER: 108 (90 BAS | 28 days supply | Qty: 18 | Fill #0

## 2016-10-08 ENCOUNTER — Other Ambulatory Visit: Payer: Self-pay | Admitting: Family Medicine

## 2016-10-08 DIAGNOSIS — J45909 Unspecified asthma, uncomplicated: Secondary | ICD-10-CM

## 2016-10-08 MED FILL — SPIRIVA 18 MCG CP-HANDIHALE: 18 | 30 days supply | Qty: 30 | Fill #2

## 2016-10-08 MED FILL — VENTOLIN HFA 90 MCG INHALER: 108 (90 BAS | 28 days supply | Qty: 18 | Fill #0

## 2016-10-16 ENCOUNTER — Other Ambulatory Visit: Payer: Self-pay

## 2016-10-16 DIAGNOSIS — J45909 Unspecified asthma, uncomplicated: Secondary | ICD-10-CM

## 2016-10-16 MED ORDER — ALBUTEROL SULFATE HFA 108 (90 BASE) MCG/ACT IN AERS
INHALATION_SPRAY | RESPIRATORY_TRACT | 0 refills | Status: DC
Start: 2016-10-16 — End: 2016-11-19

## 2016-10-16 NOTE — Telephone Encounter (Signed)
Refill for ventolin sent into pharmacy. Thanks!

## 2016-10-22 ENCOUNTER — Ambulatory Visit: Payer: No Typology Code available for payment source | Admitting: Family Medicine

## 2016-11-19 ENCOUNTER — Ambulatory Visit (INDEPENDENT_AMBULATORY_CARE_PROVIDER_SITE_OTHER): Payer: No Typology Code available for payment source | Admitting: Family Medicine

## 2016-11-19 ENCOUNTER — Encounter: Payer: Self-pay | Admitting: Family Medicine

## 2016-11-19 DIAGNOSIS — J45909 Unspecified asthma, uncomplicated: Secondary | ICD-10-CM

## 2016-11-19 MED ORDER — PNEUMOCOCCAL 13-VAL CONJ VACC IM SUSP
0.5000 mL | INTRAMUSCULAR | Status: AC
  Administered 2016-11-19: 0.5 mL via INTRAMUSCULAR

## 2016-11-19 MED ORDER — BUDESONIDE-FORMOTEROL FUMARATE 160-4.5 MCG/ACT IN AERO: 2.0000 | INHALATION_SPRAY | Freq: Two times a day (BID) | RESPIRATORY_TRACT | 3 refills | Status: DC

## 2016-11-19 MED ORDER — ALBUTEROL SULFATE HFA 108 (90 BASE) MCG/ACT IN AERS: INHALATION_SPRAY | RESPIRATORY_TRACT | 0 refills | Status: DC

## 2016-11-19 NOTE — Progress Notes (Addendum)
[name], is a 30 y.o. male  ZOX:[id number]  [id number]:[id number]  DOB - [birth year]  CC:  Chief Complaint  Patient presents with  . Asthma    needs refill on albuterol and does not think the Sherrie MustacheSpirvia is helping, wheezing increases with any kind of exercise or cardiac workout, has used Symbicort in past with better results,       HPI: Samuel ParesRaymond Maynard is a 30 y.o. male here  for follow-up on asthma. He reports using his albuterol rescue inhaler 3-4 times a day, especially with exertion. He reports feeling that Symbicort worked better than [place]pirva He reports avoiding fatty foods and exercising several times a week.   Health Maintenance: To receive flu shot today. Also needs Prevnar No Known Allergies Past Medical History:  Diagnosis Date  . Allergy   . Asthma   . HIV disease (HCC) 09/19/2015   Current Outpatient Prescriptions on File Prior to Visit  Medication Sig Dispense Refill  . emtricitabine-rilpivir-tenofovir AF (ODEFSEY) 200-25-25 MG TABS tablet Take 1 tablet by mouth daily with breakfast. 30 tablet 11  . loratadine (CLARITIN) 10 MG tablet Take 1 tablet (10 mg total) by mouth daily. 30 tablet 11  . tiotropium (SPIRIVA HANDIHALER) 18 MCG inhalation capsule Place 1 capsule (18 mcg total) into inhaler and inhale daily. 30 capsule 12  . doxycycline (VIBRAMYCIN) 100 MG capsule Take 1 capsule (100 mg total) by mouth 2 (two) times daily. (Patient not taking: Reported on 11/19/2016) 20 capsule 0   No current facility-administered medications on file prior to visit.    Family History  Problem Relation Age of Onset  . Hypertension Mother   . Cancer Maternal Grandmother   . Cancer Paternal Grandfather    Social History   Social History  . Marital status: Single    Spouse name: N/A  . Number of children: N/A  . Years of education: N/A   Occupational History  . Not on file.   Social History Main Topics  . Smoking status: Never Smoker  . Smokeless tobacco: Never Used  .  Alcohol use No  . Drug use: No  . Sexual activity: Yes    Partners: Male    Birth control/ protection: Condom   Other Topics Concern  . Not on file   Social History Narrative   Social history: Has sex with both men and women. Almost always uses a condom. Has had sexual contact with a man who has syphilis however he was using protection.   His family notes that he is out and are okay with it.  He is relieved about that.    Review of Systems: Constitutional: Negative Skin: Negative HENT: Negative  Eyes: Negative  Neck: Negative Respiratory: Some shortness of breath and wheezing related to asthma Cardiovascular: Negative Gastrointestinal: + for occ nausea.  Genitourinary: Negative  Musculoskeletal: Negative   Neurological: Negative for Hematological: Negative  Psychiatric/Behavioral: Negative    Objective:   Vitals:   11/19/16 0952  BP: 121/77  Pulse: 66  Resp: 12  Temp: 97.9 F (36.6 C)    Physical Exam: Constitutional: Patient appears well-developed and well-nourished. No distress. HENT: Normocephalic, atraumatic, External right and left ear normal. Oropharynx is clear and moist.  Eyes: Conjunctivae and EOM are normal. PERRLA, no scleral icterus. Neck: Normal ROM. Neck supple. No lymphadenopathy, No thyromegaly. CVS: RRR, S1/S2 +, no murmurs, no gallops, no rubs Pulmonary: Effort and breath sounds normal, no stridor, rhonchi, wheezes, rales.  Abdominal: Soft. Normoactive BS,, no distension, tenderness, rebound  or guarding.  Musculoskeletal: Normal range of motion. No edema and no tenderness.  Neuro: Alert.Normal muscle tone coordination. Non-focal Skin: Skin is warm and dry. No rash noted. Not diaphoretic. No erythema. No pallor. Psychiatric: Normal mood and affect. Behavior, judgment, thought content normal.  Lab Results  Component Value Date   WBC 5.3 08/22/2016   HGB 16.1 08/22/2016   HCT 48.5 08/22/2016   MCV 81.4 08/22/2016   PLT 133 (L) 08/22/2016    Lab Results  Component Value Date   CREATININE 1.27 08/22/2016   BUN 16 08/22/2016   NA 140 08/22/2016   K 4.4 08/22/2016   CL 101 08/22/2016   CO2 27 08/22/2016    No results found for: HGBA1C Lipid Panel     Component Value Date/Time   CHOL 180 08/25/2015 1407   TRIG 60 08/25/2015 1407   HDL 42 08/25/2015 1407   CHOLHDL 4.3 08/25/2015 1407   VLDL 12 08/25/2015 1407   LDLCALC 126 08/25/2015 1407        Assessment and plan:   1. Uncomplicated asthma, unspecified asthma severity, unspecified whether persistent  - pneumococcal 13-valent conjugate vaccine (PREVNAR 13) injection 0.5 mL; Inject 0.5 mLs into the muscle tomorrow at 10 am. - albuterol (VENTOLIN HFA) 108 (90 Base) MCG/ACT inhaler; INHALE 1 TO 2 PUFFS INTO THE LUNGS EVERY 6 HOURS AS NEEDED FOR WHEEZING OR SHORTNESS OF BREATH  Dispense: 18 g; Refill: 0   Return in about 6 months (around 05/19/2017).  The patient was given clear instructions to go to ER or return to medical center if symptoms don't improve, worsen or new problems develop. The patient verbalized understanding.    Henrietta HooverLinda C Bernhardt FNP  11/19/2016, 12:26 PM

## 2016-11-19 NOTE — Patient Instructions (Signed)
Will change spiriva to symbicort

## 2016-11-20 ENCOUNTER — Ambulatory Visit: Payer: No Typology Code available for payment source | Admitting: Family Medicine

## 2016-11-30 MED FILL — !VENTOLIN HFA INHALER: 108 (90 BAS | 30 days supply | Qty: 18 | Fill #0

## 2016-11-30 MED FILL — SYMBICORT 160-4.5 MCG INH: 160-4.5 | 1 days supply | Qty: 10 | Fill #0

## 2017-01-08 ENCOUNTER — Other Ambulatory Visit: Payer: Self-pay | Admitting: Infectious Disease

## 2017-01-09 ENCOUNTER — Ambulatory Visit: Payer: No Typology Code available for payment source

## 2017-01-09 ENCOUNTER — Other Ambulatory Visit: Payer: No Typology Code available for payment source

## 2017-01-10 ENCOUNTER — Ambulatory Visit: Payer: No Typology Code available for payment source

## 2017-01-10 ENCOUNTER — Other Ambulatory Visit: Payer: No Typology Code available for payment source

## 2017-01-22 ENCOUNTER — Other Ambulatory Visit (INDEPENDENT_AMBULATORY_CARE_PROVIDER_SITE_OTHER): Payer: Self-pay

## 2017-01-22 ENCOUNTER — Other Ambulatory Visit (HOSPITAL_COMMUNITY)
Admission: RE | Admit: 2017-01-22 | Discharge: 2017-01-22 | Disposition: A | Discharge status: Home / Self Care | Payer: No Typology Code available for payment source | Source: Ambulatory Visit | Attending: Infectious Disease | Admitting: Infectious Disease

## 2017-01-22 DIAGNOSIS — Z79899 Other long term (current) drug therapy: Secondary | ICD-10-CM

## 2017-01-22 DIAGNOSIS — B20 Human immunodeficiency virus [HIV] disease: Secondary | ICD-10-CM

## 2017-01-22 DIAGNOSIS — Z113 Encounter for screening for infections with a predominantly sexual mode of transmission: Secondary | ICD-10-CM | POA: Insufficient documentation

## 2017-01-22 LAB — CBC WITH DIFFERENTIAL/PLATELET
BASOS ABS: 0 {cells}/uL (ref 0–200)
Basophils Relative: 0 %
EOS PCT: 3 %
Eosinophils Absolute: 162 cells/uL (ref 15–500)
HEMATOCRIT: 48.1 % (ref 38.5–50.0)
HEMOGLOBIN: 15.9 g/dL (ref 13.2–17.1)
LYMPHS ABS: 1836 {cells}/uL (ref 850–3900)
Lymphocytes Relative: 34 %
MCH: 28.1 pg (ref 27.0–33.0)
MCHC: 33.1 g/dL (ref 32.0–36.0)
MCV: 85 fL (ref 80.0–100.0)
MONO ABS: 486 {cells}/uL (ref 200–950)
MPV: 10.3 fL (ref 7.5–12.5)
Monocytes Relative: 9 %
NEUTROS PCT: 54 %
Neutro Abs: 2916 cells/uL (ref 1500–7800)
Platelets: 143 10*3/uL (ref 140–400)
RBC: 5.66 MIL/uL (ref 4.20–5.80)
RDW: 15 % (ref 11.0–15.0)
WBC: 5.4 10*3/uL (ref 3.8–10.8)

## 2017-01-22 LAB — COMPLETE METABOLIC PANEL WITH GFR
ALK PHOS: 51 U/L (ref 40–115)
ALT: 23 U/L (ref 9–46)
AST: 20 U/L (ref 10–40)
Albumin: 4.6 g/dL (ref 3.6–5.1)
BUN: 14 mg/dL (ref 7–25)
CHLORIDE: 103 mmol/L (ref 98–110)
CO2: 27 mmol/L (ref 20–31)
Calcium: 9.5 mg/dL (ref 8.6–10.3)
Creat: 1.18 mg/dL (ref 0.60–1.35)
GFR, EST NON AFRICAN AMERICAN: 83 mL/min (ref >=60)
GFR, Est African American: >89 mL/min (ref >=60)
GLUCOSE: 80 mg/dL (ref 65–99)
POTASSIUM: 4.1 mmol/L (ref 3.5–5.3)
SODIUM: 138 mmol/L (ref 135–146)
Total Bilirubin: 1.3 mg/dL — ABNORMAL HIGH (ref 0.2–1.2)
Total Protein: 7.8 g/dL (ref 6.1–8.1)

## 2017-01-22 LAB — LIPID PANEL
CHOL/HDL RATIO: 5.3 ratio — AB (ref <5.0)
Cholesterol: 176 mg/dL (ref <200)
HDL: 33 mg/dL — AB (ref >40)
LDL Cholesterol: 131 mg/dL — ABNORMAL HIGH (ref <100)
Triglycerides: 61 mg/dL (ref <150)
VLDL: 12 mg/dL (ref <30)

## 2017-01-23 ENCOUNTER — Encounter: Payer: Self-pay | Admitting: Infectious Disease

## 2017-01-23 ENCOUNTER — Ambulatory Visit (INDEPENDENT_AMBULATORY_CARE_PROVIDER_SITE_OTHER): Payer: No Typology Code available for payment source | Admitting: Infectious Disease

## 2017-01-23 VITALS — BP 133/74 | HR 68 | Temp 99.0°F | Ht 65.0 in | Wt 152.0 lb

## 2017-01-23 DIAGNOSIS — F1290 Cannabis use, unspecified, uncomplicated: Secondary | ICD-10-CM | POA: Insufficient documentation

## 2017-01-23 DIAGNOSIS — F1220 Cannabis dependence, uncomplicated: Secondary | ICD-10-CM

## 2017-01-23 DIAGNOSIS — J45909 Unspecified asthma, uncomplicated: Secondary | ICD-10-CM

## 2017-01-23 DIAGNOSIS — B20 Human immunodeficiency virus [HIV] disease: Secondary | ICD-10-CM

## 2017-01-23 HISTORY — DX: Cannabis use, unspecified, uncomplicated: F12.90

## 2017-01-23 LAB — URINE CYTOLOGY ANCILLARY ONLY
Chlamydia: NEGATIVE
Neisseria Gonorrhea: NEGATIVE

## 2017-01-23 LAB — RPR

## 2017-01-23 LAB — T-HELPER CELL (CD4) - (RCID CLINIC ONLY)
CD4 % Helper T Cell: 32 % — ABNORMAL LOW (ref 33–55)
CD4 T Cell Abs: 640 /uL (ref 400–2700)

## 2017-01-23 MED FILL — SYMBICORT 160-4.5 MCG INH: 160-4.5 | 30 days supply | Qty: 10 | Fill #1

## 2017-01-23 NOTE — Progress Notes (Signed)
Subjective:  Chief complaint: followup for HIV on meds   Patient ID: [name], male    DOB: [birth year], 31 y.o.   MRN: [id number]  HPI  31 year old [name] with relatively recently diagnosed HIV infection. He had been tested at least yearly for multiple years last checked in 2015 January. His viral load was not especially high 13,000 range and CD4 count is healthy at 500. His resistance testing shows no evidence of resistance to NNRTI's and RTIs or PI's. He is HLA B5701 negative he does not have viral hepatitis. We opted to go with ODEFSEY and he has suppressed nicely on this regimen.   He tells me that he has recently began smoking marijuana twice daily which has helped him with increased stressors of multiple responsibilities in his family and personal, professional life. He is abstinent from alcohol and also has not been sexually active and feels  This is one of the only "outlets" he has to effectively relieve stress.   Past Medical History:  Diagnosis Date  . Allergy   . Asthma   . HIV disease ([place]) 09/19/2015    No past surgical history on file.  Family History  Problem Relation Age of Onset  . Hypertension Mother   . Cancer Maternal Grandmother   . Cancer Paternal Grandfather       reports that he has never smoked. He has never used smokeless tobacco. He reports that he does not drink alcohol or use drugs.   Family History  Problem Relation Age of Onset  . Hypertension Mother   . Cancer Maternal Grandmother   . Cancer Paternal Grandfather     No Known Allergies   Current Outpatient Prescriptions:  .  albuterol (VENTOLIN HFA) 108 (90 Base) MCG/ACT inhaler, INHALE 1 TO 2 PUFFS INTO THE LUNGS EVERY 6 HOURS AS NEEDED FOR WHEEZING OR SHORTNESS OF BREATH, Disp: 18 g, Rfl: 0 .  budesonide-formoterol (SYMBICORT) 160-4.5 MCG/ACT inhaler, Inhale 2 puffs into the lungs 2 (two) times daily., Disp: 1 Inhaler, Rfl: 3 .  loratadine (CLARITIN) 10 MG tablet,  Take 1 tablet (10 mg total) by mouth daily., Disp: 30 tablet, Rfl: 11 .  ODEFSEY 200-25-25 MG TABS tablet, TAKE 1 TABLET BY MOUTH DAILY WITH BREAKFAST, Disp: 30 tablet, Rfl: 5   Review of Systems  Constitutional: Negative for activity change, appetite change, chills, diaphoresis, fatigue, fever and unexpected weight change.  HENT: Negative for congestion, rhinorrhea, sinus pressure, sneezing, sore throat and trouble swallowing.   Eyes: Negative for photophobia and visual disturbance.  Respiratory: Negative for cough, chest tightness, shortness of breath, wheezing and stridor.   Cardiovascular: Negative for chest pain, palpitations and leg swelling.  Gastrointestinal: Negative for abdominal distention, abdominal pain, anal bleeding, blood in stool, constipation, diarrhea, nausea and vomiting.  Genitourinary: Negative for difficulty urinating, dysuria, flank pain and hematuria.  Musculoskeletal: Negative for arthralgias, back pain, gait problem, joint swelling and myalgias.  Skin: Negative for color change, pallor, rash and wound.  Neurological: Negative for dizziness, tremors, weakness and light-headedness.  Hematological: Negative for adenopathy. Does not bruise/bleed easily.  Psychiatric/Behavioral: Negative for agitation, behavioral problems, confusion, decreased concentration, dysphoric mood and sleep disturbance.       Objective:   Physical Exam  Constitutional: He is oriented to person, place, and time. He appears well-developed and well-nourished.  HENT:  Head: Normocephalic and atraumatic.  Eyes: Conjunctivae and EOM are normal.  Neck: Normal range of motion. Neck supple.  Cardiovascular: Normal rate  and regular rhythm.   Pulmonary/Chest: Effort normal. No respiratory distress. He has no wheezes.  Abdominal: Soft. He exhibits no distension.  Musculoskeletal: Normal range of motion. He exhibits no edema or tenderness.  Neurological: He is alert and oriented to person, place, and  time.  Skin: Skin is warm and dry. No rash noted. No erythema. No pallor.  Psychiatric: He has a normal mood and affect. His behavior is normal. Judgment and thought content normal.          Assessment & Plan:  HIV disease: continue ODEFSEY with chewed  Meal, labs pending, renewed ADAP and RTC in 6 months and needs meningitis vaccine when it is back in stock.  Asthma: has seasonal variance to it: he does take inhaled steroid why on sprivia  Marijuana use: does not seem to be an issue as far as interfering with his lifee  I spent greater than 25 minutes with the patient including greater than 50% of time in face to face counsel of the patient re his HIV, marijuana use and in coordination of his care.

## 2017-01-24 LAB — HIV-1 RNA QUANT-NO REFLEX-BLD
HIV 1 RNA Quant: <20 copies/mL
HIV-1 RNA Quant, Log: <1.3 Log copies/mL

## 2017-01-25 ENCOUNTER — Ambulatory Visit: Payer: No Typology Code available for payment source | Attending: Family Medicine

## 2017-02-08 ENCOUNTER — Other Ambulatory Visit: Payer: Self-pay | Admitting: *Deleted

## 2017-02-08 MED ORDER — BUDESONIDE-FORMOTEROL FUMARATE 160-4.5 MCG/ACT IN AERO: 2.0000 | INHALATION_SPRAY | Freq: Two times a day (BID) | RESPIRATORY_TRACT | 3 refills | Status: DC

## 2017-02-08 NOTE — Telephone Encounter (Signed)
PRINTED FOR PASS PROGRAM 

## 2017-02-12 ENCOUNTER — Encounter: Payer: Self-pay | Admitting: Infectious Disease

## 2017-02-14 ENCOUNTER — Other Ambulatory Visit: Payer: Self-pay | Admitting: *Deleted

## 2017-02-14 DIAGNOSIS — J45909 Unspecified asthma, uncomplicated: Secondary | ICD-10-CM

## 2017-02-14 MED ORDER — ALBUTEROL SULFATE HFA 108 (90 BASE) MCG/ACT IN AERS: INHALATION_SPRAY | RESPIRATORY_TRACT | 3 refills | Status: DC

## 2017-02-14 NOTE — Telephone Encounter (Signed)
PRINTED FOR PASS PROGRAM 

## 2017-03-11 ENCOUNTER — Ambulatory Visit: Payer: No Typology Code available for payment source | Admitting: Family Medicine

## 2017-04-01 MED FILL — $Symbicort 160-4.5mcg/act: 160-4.5 | 30 days supply | Qty: 1 | Fill #2

## 2017-04-02 ENCOUNTER — Ambulatory Visit: Payer: No Typology Code available for payment source | Admitting: Family Medicine

## 2017-04-22 MED FILL — $VENTOLIN HFA 18G INHALER: 108 (90 BAS | 90 days supply | Qty: 54 | Fill #0

## 2017-04-30 ENCOUNTER — Ambulatory Visit: Payer: No Typology Code available for payment source | Admitting: Family Medicine

## 2017-05-02 MED FILL — $Symbicort 160-4.5mcg/act: 160-4.5 | 30 days supply | Qty: 3 | Fill #0

## 2017-06-14 ENCOUNTER — Other Ambulatory Visit: Payer: Self-pay | Admitting: Infectious Disease

## 2017-06-14 DIAGNOSIS — B20 Human immunodeficiency virus [HIV] disease: Secondary | ICD-10-CM

## 2017-06-19 ENCOUNTER — Other Ambulatory Visit: Payer: Self-pay

## 2017-06-19 DIAGNOSIS — B20 Human immunodeficiency virus [HIV] disease: Secondary | ICD-10-CM

## 2017-06-19 LAB — CBC WITH DIFFERENTIAL/PLATELET
BASOS ABS: 0 {cells}/uL (ref 0–200)
Basophils Relative: 0 %
EOS ABS: 196 {cells}/uL (ref 15–500)
Eosinophils Relative: 4 %
HEMATOCRIT: 49.3 % (ref 38.5–50.0)
Hemoglobin: 16.1 g/dL (ref 13.2–17.1)
LYMPHS PCT: 41 %
Lymphs Abs: 2009 cells/uL (ref 850–3900)
MCH: 28.1 pg (ref 27.0–33.0)
MCHC: 32.7 g/dL (ref 32.0–36.0)
MCV: 86 fL (ref 80.0–100.0)
MONO ABS: 490 {cells}/uL (ref 200–950)
MPV: 9.6 fL (ref 7.5–12.5)
Monocytes Relative: 10 %
NEUTROS PCT: 45 %
Neutro Abs: 2205 cells/uL (ref 1500–7800)
Platelets: 128 10*3/uL — ABNORMAL LOW (ref 140–400)
RBC: 5.73 MIL/uL (ref 4.20–5.80)
RDW: 14.4 % (ref 11.0–15.0)
WBC: 4.9 10*3/uL (ref 3.8–10.8)

## 2017-06-19 LAB — T-HELPER CELL (CD4) - (RCID CLINIC ONLY)
CD4 % Helper T Cell: 34 % (ref 33–55)
CD4 T Cell Abs: 710 /uL (ref 400–2700)

## 2017-06-19 LAB — LIPID PANEL
CHOL/HDL RATIO: 4 ratio (ref <5.0)
Cholesterol: 185 mg/dL (ref <200)
HDL: 46 mg/dL (ref >40)
LDL CALC: 128 mg/dL — AB (ref <100)
Triglycerides: 57 mg/dL (ref <150)
VLDL: 11 mg/dL (ref <30)

## 2017-06-19 LAB — COMPLETE METABOLIC PANEL WITH GFR
ALT: 17 U/L (ref 9–46)
AST: 17 U/L (ref 10–40)
Albumin: 4.5 g/dL (ref 3.6–5.1)
Alkaline Phosphatase: 51 U/L (ref 40–115)
BILIRUBIN TOTAL: 1.6 mg/dL — AB (ref 0.2–1.2)
BUN: 14 mg/dL (ref 7–25)
CO2: 29 mmol/L (ref 20–31)
Calcium: 9.8 mg/dL (ref 8.6–10.3)
Chloride: 99 mmol/L (ref 98–110)
Creat: 1.16 mg/dL (ref 0.60–1.35)
GFR, EST NON AFRICAN AMERICAN: 85 mL/min (ref >=60)
GFR, Est African American: >89 mL/min (ref >=60)
Glucose, Bld: 89 mg/dL (ref 65–99)
POTASSIUM: 4.4 mmol/L (ref 3.5–5.3)
SODIUM: 137 mmol/L (ref 135–146)
TOTAL PROTEIN: 7.4 g/dL (ref 6.1–8.1)

## 2017-06-20 LAB — RPR

## 2017-06-22 LAB — HIV-1 RNA QUANT-NO REFLEX-BLD
HIV 1 RNA Quant: <20 copies/mL
HIV-1 RNA Quant, Log: <1.3 Log copies/mL

## 2017-06-25 MED FILL — $Symbicort 160-4.5mcg/act: 160-4.5 | 30 days supply | Qty: 1 | Fill #1

## 2017-07-03 ENCOUNTER — Ambulatory Visit (INDEPENDENT_AMBULATORY_CARE_PROVIDER_SITE_OTHER): Payer: No Typology Code available for payment source | Admitting: Infectious Disease

## 2017-07-03 ENCOUNTER — Encounter: Payer: Self-pay | Admitting: Infectious Disease

## 2017-07-03 VITALS — BP 122/76 | HR 77 | Temp 98.0°F | Ht 65.0 in | Wt 153.0 lb

## 2017-07-03 DIAGNOSIS — B20 Human immunodeficiency virus [HIV] disease: Secondary | ICD-10-CM

## 2017-07-03 NOTE — Progress Notes (Signed)
Subjective:  Chief complaint: followup for HIV on meds   Patient ID: [name], male    DOB: 09/26/1986, 31 y.o.   MRN: [id number]  HPI  31 year old [name] with relatively recently diagnosed HIV infection. He had been tested at least yearly for multiple years last checked in 2015 January. His viral load was not especially high 13,000 range and CD4 count is healthy at 500. His resistance testing shows no evidence of resistance to NNRTI's and RTIs or PI's. He is HLA B5701 negative he does not have viral hepatitis. We opted to go with ODEFSEY and he has suppressed nicely on this regimen.   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  Lab Results  Component Value Date   HIV1RNAQUANT <20 NOT DETECTED 06/19/2017   HIV1RNAQUANT <20 NOT DETECTED 01/22/2017   HIV1RNAQUANT 25 (H) 08/22/2016   Lab Results  Component Value Date   CD4TABS 710 06/19/2017   CD4TABS 640 01/22/2017   CD4TABS 630 08/22/2016     Past Medical History:  Diagnosis Date  . Allergy   . Asthma   . HIV disease ([place]) 09/19/2015  . Marijuana smoker 01/23/2017    No past surgical history on file.  Family History  Problem Relation Age of Onset  . Hypertension Mother   . Cancer Maternal Grandmother   . Cancer Paternal Grandfather       reports that he has never smoked. He has never used smokeless tobacco. He reports that he does not drink alcohol or use drugs.   Family History  Problem Relation Age of Onset  . Hypertension Mother   . Cancer Maternal Grandmother   . Cancer Paternal Grandfather     No Known Allergies   Current Outpatient Prescriptions:  .  albuterol (VENTOLIN HFA) 108 (90 Base) MCG/ACT inhaler, INHALE 1 TO 2 PUFFS INTO THE LUNGS EVERY 6 HOURS AS NEEDED FOR WHEEZING OR SHORTNESS OF BREATH, Disp: 54 g, Rfl: 3 .  budesonide-formoterol (SYMBICORT) 160-4.5 MCG/ACT inhaler, Inhale 2 puffs into the lungs 2 (two) times daily., Disp: 3 Inhaler, Rfl: 3 .  loratadine (CLARITIN) 10 MG  tablet, Take 1 tablet (10 mg total) by mouth daily., Disp: 30 tablet, Rfl: 11 .  ODEFSEY 200-25-25 MG TABS tablet, TAKE 1 TABLET BY MOUTH DAILY WITH BREAKFAST, Disp: 30 tablet, Rfl: 0   Review of Systems  Constitutional: Negative for activity change, appetite change, chills, diaphoresis, fatigue, fever and unexpected weight change.  HENT: Negative for congestion, rhinorrhea, sinus pressure, sneezing, sore throat and trouble swallowing.   Eyes: Negative for photophobia and visual disturbance.  Respiratory: Negative for cough, chest tightness, shortness of breath, wheezing and stridor.   Cardiovascular: Negative for chest pain, palpitations and leg swelling.  Gastrointestinal: Negative for abdominal distention, abdominal pain, anal bleeding, blood in stool, constipation, diarrhea, nausea and vomiting.  Genitourinary: Negative for difficulty urinating, dysuria, flank pain and hematuria.  Musculoskeletal: Negative for arthralgias, back pain, gait problem, joint swelling and myalgias.  Skin: Negative for color change, pallor, rash and wound.  Neurological: Negative for dizziness, tremors, weakness and light-headedness.  Hematological: Negative for adenopathy. Does not bruise/bleed easily.  Psychiatric/Behavioral: Negative for agitation, behavioral problems, confusion, decreased concentration, dysphoric mood and sleep disturbance.       Objective:   Physical Exam  Constitutional: He is oriented to person, place, and time. He appears well-developed and well-nourished.  HENT:  Head: Normocephalic and atraumatic.  Eyes: Conjunctivae and EOM are normal.  Neck: Normal range of  motion. Neck supple.  Cardiovascular: Normal rate and regular rhythm.   Pulmonary/Chest: Effort normal. No respiratory distress. He has no wheezes.  Abdominal: Soft. He exhibits no distension.  Musculoskeletal: Normal range of motion. He exhibits no edema or tenderness.  Neurological: He is alert and oriented to person,  place, and time.  Skin: Skin is warm and dry. No rash noted. No erythema. No pallor.  Psychiatric: He has a normal mood and affect. His behavior is normal. Judgment and thought content normal.          Assessment & Plan:  HIV disease: continue ODEFSEY with chewed  Meal, labs pending, and RTC in 6 months and needs meningitis vaccine

## 2017-07-26 MED FILL — $Symbicort 160-4.5mcg/act: 160-4.5 | 30 days supply | Qty: 1 | Fill #2

## 2017-07-26 MED FILL — $VENTOLIN HFA 18G INHALER: 108 (90 BAS | 25 days supply | Qty: 18 | Fill #1

## 2017-07-30 ENCOUNTER — Other Ambulatory Visit: Payer: Self-pay | Admitting: Infectious Disease

## 2017-07-30 DIAGNOSIS — B20 Human immunodeficiency virus [HIV] disease: Secondary | ICD-10-CM

## 2017-09-04 MED FILL — SYMBICORT 160-4.5 MCG INH: 160-4.5 | 30 days supply | Qty: 10 | Fill #3

## 2017-09-04 MED FILL — $VENTOLIN HFA 18G INHALER: 108 (90 BAS | 25 days supply | Qty: 18 | Fill #2

## 2017-10-14 MED FILL — $Symbicort 160-4.5mcg/act: 160-4.5 | 30 days supply | Qty: 1 | Fill #4

## 2017-11-08 MED FILL — $Symbicort 160-4.5mcg/act: 160-4.5 | 30 days supply | Qty: 1 | Fill #5

## 2017-11-08 MED FILL — $VENTOLIN HFA 18G INHALER: 108 (90 BAS | 25 days supply | Qty: 18 | Fill #3

## 2017-12-18 MED FILL — $VENTOLIN HFA 18G INHALER: 108 (90 BAS | 25 days supply | Qty: 18 | Fill #4

## 2017-12-18 MED FILL — $Symbicort 160-4.5mcg/act: 160-4.5 | 30 days supply | Qty: 1 | Fill #6

## 2017-12-23 ENCOUNTER — Other Ambulatory Visit: Payer: Self-pay

## 2017-12-25 ENCOUNTER — Other Ambulatory Visit: Payer: Self-pay

## 2017-12-25 ENCOUNTER — Ambulatory Visit: Payer: Self-pay

## 2017-12-25 DIAGNOSIS — B20 Human immunodeficiency virus [HIV] disease: Secondary | ICD-10-CM

## 2017-12-26 LAB — CBC WITH DIFFERENTIAL/PLATELET
Basophils Absolute: 22 cells/uL (ref 0–200)
Basophils Relative: 0.4 %
EOS PCT: 3.9 %
Eosinophils Absolute: 211 cells/uL (ref 15–500)
HCT: 45.6 % (ref 38.5–50.0)
Hemoglobin: 15.3 g/dL (ref 13.2–17.1)
Lymphs Abs: 1771 cells/uL (ref 850–3900)
MCH: 28 pg (ref 27.0–33.0)
MCHC: 33.6 g/dL (ref 32.0–36.0)
MCV: 83.5 fL (ref 80.0–100.0)
MONOS PCT: 10.6 %
MPV: 10.3 fL (ref 7.5–12.5)
NEUTROS PCT: 52.3 %
Neutro Abs: 2824 cells/uL (ref 1500–7800)
Platelets: 141 10*3/uL (ref 140–400)
RBC: 5.46 10*6/uL (ref 4.20–5.80)
RDW: 14 % (ref 11.0–15.0)
TOTAL LYMPHOCYTE: 32.8 %
WBC: 5.4 10*3/uL (ref 3.8–10.8)
WBCMIX: 572 {cells}/uL (ref 200–950)

## 2017-12-26 LAB — COMPLETE METABOLIC PANEL WITH GFR
AG Ratio: 1.9 (calc) (ref 1.0–2.5)
ALBUMIN MSPROF: 4.5 g/dL (ref 3.6–5.1)
ALKALINE PHOSPHATASE (APISO): 49 U/L (ref 40–115)
ALT: 16 U/L (ref 9–46)
AST: 19 U/L (ref 10–40)
BILIRUBIN TOTAL: 1.6 mg/dL — AB (ref 0.2–1.2)
BUN: 12 mg/dL (ref 7–25)
CHLORIDE: 101 mmol/L (ref 98–110)
CO2: 32 mmol/L (ref 20–32)
Calcium: 9.5 mg/dL (ref 8.6–10.3)
Creat: 1.25 mg/dL (ref 0.60–1.35)
GFR, Est African American: 90 mL/min/{1.73_m2} (ref >=60)
GFR, Est Non African American: 77 mL/min/{1.73_m2} (ref >=60)
GLOBULIN: 2.4 g/dL (ref 1.9–3.7)
Glucose, Bld: 61 mg/dL — ABNORMAL LOW (ref 65–99)
Potassium: 4.3 mmol/L (ref 3.5–5.3)
SODIUM: 140 mmol/L (ref 135–146)
Total Protein: 6.9 g/dL (ref 6.1–8.1)

## 2017-12-26 LAB — RPR: RPR Ser Ql: NONREACTIVE

## 2017-12-26 LAB — URINE CYTOLOGY ANCILLARY ONLY
CHLAMYDIA, DNA PROBE: NEGATIVE
NEISSERIA GONORRHEA: NEGATIVE

## 2017-12-26 LAB — T-HELPER CELL (CD4) - (RCID CLINIC ONLY)
CD4 % Helper T Cell: 32 % — ABNORMAL LOW (ref 33–55)
CD4 T CELL ABS: 610 /uL (ref 400–2700)

## 2017-12-27 LAB — HIV-1 RNA QUANT-NO REFLEX-BLD
HIV 1 RNA QUANT: NOT DETECTED {copies}/mL
HIV-1 RNA Quant, Log: <1.3 Log copies/mL

## 2018-01-06 ENCOUNTER — Encounter: Payer: Self-pay | Admitting: Infectious Disease

## 2018-01-06 ENCOUNTER — Ambulatory Visit (INDEPENDENT_AMBULATORY_CARE_PROVIDER_SITE_OTHER): Payer: Self-pay | Admitting: Infectious Disease

## 2018-01-06 VITALS — BP 131/77 | HR 78 | Temp 98.4°F | Ht 65.0 in | Wt 164.0 lb

## 2018-01-06 DIAGNOSIS — B20 Human immunodeficiency virus [HIV] disease: Secondary | ICD-10-CM

## 2018-01-06 DIAGNOSIS — Z23 Encounter for immunization: Secondary | ICD-10-CM

## 2018-01-06 DIAGNOSIS — E162 Hypoglycemia, unspecified: Secondary | ICD-10-CM

## 2018-01-06 HISTORY — DX: Hypoglycemia, unspecified: E16.2

## 2018-01-06 NOTE — Progress Notes (Signed)
Subjective:  Chief complaint: followup for HIV on meds   Patient ID: [name], male    DOB: [birth year], 32 y.o.   MRN: [id number]  HPI  32 year old [place] American man who was put on therapy shortly after being diagnosed with HIV.  We opted to go with ODEFSEY and he has suppressed nicely on this regimen.   He is without complaints today and does need to renew his HMAP.  Lab Results  Component Value Date   HIV1RNAQUANT <20 NOT DETECTED 12/25/2017   HIV1RNAQUANT <20 NOT DETECTED 06/19/2017   HIV1RNAQUANT <20 NOT DETECTED 01/22/2017   Lab Results  Component Value Date   CD4TABS 610 12/25/2017   CD4TABS 710 06/19/2017   CD4TABS 640 01/22/2017     Past Medical History:  Diagnosis Date  . Allergy   . Asthma   . HIV disease (HCC) 09/19/2015  . Marijuana smoker 01/23/2017    No past surgical history on file.  Family History  Problem Relation Age of Onset  . Hypertension Mother   . Cancer Maternal Grandmother   . Cancer Paternal Grandfather       reports that  has never smoked. he has never used smokeless tobacco. He reports that he does not drink alcohol or use drugs.   Family History  Problem Relation Age of Onset  . Hypertension Mother   . Cancer Maternal Grandmother   . Cancer Paternal Grandfather     No Known Allergies   Current Outpatient Medications:  .  albuterol (VENTOLIN HFA) 108 (90 Base) MCG/ACT inhaler, INHALE 1 TO 2 PUFFS INTO THE LUNGS EVERY 6 HOURS AS NEEDED FOR WHEEZING OR SHORTNESS OF BREATH, Disp: 54 g, Rfl: 3 .  budesonide-formoterol (SYMBICORT) 160-4.5 MCG/ACT inhaler, Inhale 2 puffs into the lungs 2 (two) times daily., Disp: 3 Inhaler, Rfl: 3 .  loratadine (CLARITIN) 10 MG tablet, Take 1 tablet (10 mg total) by mouth daily., Disp: 30 tablet, Rfl: 11 .  ODEFSEY 200-25-25 MG TABS tablet, TAKE 1 TABLET BY MOUTH DAILY WITH BREAKFAST, Disp: 30 tablet, Rfl: 4   Review of Systems  Constitutional: Negative for activity change, appetite  change, chills, diaphoresis, fatigue, fever and unexpected weight change.  HENT: Negative for congestion, rhinorrhea, sinus pressure, sneezing, sore throat and trouble swallowing.   Eyes: Negative for photophobia and visual disturbance.  Respiratory: Negative for cough, chest tightness, shortness of breath, wheezing and stridor.   Cardiovascular: Negative for chest pain, palpitations and leg swelling.  Gastrointestinal: Negative for abdominal distention, abdominal pain, anal bleeding, blood in stool, constipation, diarrhea, nausea and vomiting.  Genitourinary: Negative for difficulty urinating, dysuria, flank pain and hematuria.  Musculoskeletal: Negative for arthralgias, back pain, gait problem, joint swelling and myalgias.  Skin: Negative for color change, pallor, rash and wound.  Neurological: Negative for dizziness, tremors, weakness and light-headedness.  Hematological: Negative for adenopathy. Does not bruise/bleed easily.  Psychiatric/Behavioral: Negative for agitation, behavioral problems, confusion, decreased concentration, dysphoric mood and sleep disturbance.       Objective:   Physical Exam  Constitutional: He is oriented to person, place, and time. He appears well-developed and well-nourished.  HENT:  Head: Normocephalic and atraumatic.  Eyes: Conjunctivae and EOM are normal.  Neck: Normal range of motion. Neck supple.  Cardiovascular: Normal rate and regular rhythm.  Pulmonary/Chest: Effort normal. No respiratory distress. He has no wheezes.  Abdominal: Soft. He exhibits no distension.  Musculoskeletal: Normal range of motion. He exhibits no edema or tenderness.  Neurological: He is  alert and oriented to person, place, and time.  Skin: Skin is warm and dry. No rash noted. No erythema. No pallor.  Psychiatric: He has a normal mood and affect. His behavior is normal. Judgment and thought content normal.  Nursing note and vitals reviewed.         Assessment & Plan:    HIV disease: continue ODEFSEY with chewed  Meal . I reviewed all of his relevant labs for him and we printed him copies. Fluvax and meningococcus #2 given  Low blood sugar noted on CMP: he was fasting but has and had no symptoms of low BG  Hyperlipidemia: need to recheck lipids at next visit.

## 2018-02-03 MED FILL — $VENTOLIN HFA 18G INHALER: 108 (90 BAS | 25 days supply | Qty: 18 | Fill #5

## 2018-02-03 MED FILL — $Symbicort 160-4.5mcg/act: 160-4.5 | 30 days supply | Qty: 1 | Fill #7

## 2018-02-18 ENCOUNTER — Encounter: Payer: Self-pay | Admitting: Infectious Disease

## 2018-02-21 ENCOUNTER — Ambulatory Visit: Payer: No Typology Code available for payment source | Attending: Family Medicine

## 2018-03-13 ENCOUNTER — Other Ambulatory Visit: Payer: Self-pay | Admitting: Internal Medicine

## 2018-03-31 ENCOUNTER — Telehealth: Payer: Self-pay | Admitting: *Deleted

## 2018-03-31 NOTE — Telephone Encounter (Signed)
[facility] asked for permission to [facility] [place], as patient hasn't filled a prescription through them since November 2018.  [facility], was undetectable.  RN called, left message for patient, asking him to call back. Will check to see if he has missed a dose. Andree CossHowell, Michelle M, RN

## 2018-04-01 NOTE — Telephone Encounter (Signed)
[name] is NORMALLy completely reliable

## 2018-04-24 ENCOUNTER — Other Ambulatory Visit: Payer: Self-pay | Admitting: Infectious Disease

## 2018-04-24 DIAGNOSIS — B20 Human immunodeficiency virus [HIV] disease: Secondary | ICD-10-CM

## 2018-05-01 ENCOUNTER — Encounter: Payer: Self-pay | Admitting: Internal Medicine

## 2018-05-01 ENCOUNTER — Ambulatory Visit: Payer: Self-pay | Admitting: Internal Medicine

## 2018-05-01 ENCOUNTER — Other Ambulatory Visit: Payer: Self-pay

## 2018-05-01 VITALS — BP 130/68 | HR 88 | Temp 98.2°F | Ht 65.0 in | Wt 166.2 lb

## 2018-05-01 DIAGNOSIS — B20 Human immunodeficiency virus [HIV] disease: Secondary | ICD-10-CM

## 2018-05-01 DIAGNOSIS — Z8709 Personal history of other diseases of the respiratory system: Secondary | ICD-10-CM

## 2018-05-01 DIAGNOSIS — Z79899 Other long term (current) drug therapy: Secondary | ICD-10-CM

## 2018-05-01 DIAGNOSIS — Z7951 Long term (current) use of inhaled steroids: Secondary | ICD-10-CM

## 2018-05-01 DIAGNOSIS — J302 Other seasonal allergic rhinitis: Secondary | ICD-10-CM

## 2018-05-01 DIAGNOSIS — J4550 Severe persistent asthma, uncomplicated: Secondary | ICD-10-CM

## 2018-05-01 MED ORDER — FLUTICASONE PROPIONATE 50 MCG/ACT NA SUSP: 2.0000 | Freq: Every day | NASAL | Status: DC

## 2018-05-01 MED ORDER — ALBUTEROL SULFATE HFA 108 (90 BASE) MCG/ACT IN AERS
INHALATION_SPRAY | RESPIRATORY_TRACT | 3 refills | Status: DC
Start: 2018-05-22 — End: 2018-06-24

## 2018-05-01 MED ORDER — BUDESONIDE-FORMOTEROL FUMARATE 160-4.5 MCG/ACT IN AERO: 2.0000 | INHALATION_SPRAY | Freq: Two times a day (BID) | RESPIRATORY_TRACT | 3 refills | Status: DC

## 2018-05-01 NOTE — Patient Instructions (Addendum)
It was a pleasure to meet you today.  We can definitely prescribe you asthma medicines from our clinic but hopefully these will be affordable again once he gets set up with insurance coverage.  I will go ahead and send them to the pharmacy with a fill date 3 weeks from now and generally they can hold that for 10 days afterwards.  If there is any issue with that just call our clinic and we can readdress it this does not need a new office visit.  We can see back again in about 3 months just to make sure everything is going okay with your asthma and we are up-to-date on everything.

## 2018-05-01 NOTE — Progress Notes (Signed)
Internal Medicine Clinic Attending  Case discussed with Dr. Rice at the time of the visit.  We reviewed the resident's history and exam and pertinent patient test results.  I agree with the assessment, diagnosis, and plan of care documented in the resident's note.  

## 2018-05-01 NOTE — Progress Notes (Signed)
CC: New patient to establish care for asthma  HPI:  [name] is a 32 y.o. male with PMHx detailed below presenting as a new patient to the internal medicine center who needs to get back on medication for his history of severe asthma.  He has been having increased symptoms over the past 6 weeks due to not having a Symbicort inhaler anymore as well as increasing seasonal allergy symptoms.  He has allergic conjunctivitis, rhinitis, mildly productive cough worse at night.  He is taking over-the-counter loratadine 10 mg daily and Flonase daily at this time plus using his albuterol inhaler as needed.  He is maintaining a good amount of physical activity with walking and running about 7 miles 3 times per week.  He is currently in the process of getting reinstated with insurance and estimates it will take 2 to 3 weeks for this.  See problem based assessment and plan below for additional details.  Severe asthma He has moderate to severe persistent asthma symptoms with a history of multiple exacerbations including hospitalization in 2017 requiring intubation for mechanical ventilation due to respiratory failure.  His symptoms were pretty well controlled on a regimen of Symbicort twice daily plus as needed albuterol however he is run out of medicine for the past 6 weeks.  This is been problematic since he lost insurance coverage is working on getting this established through his new job, which she expects will be in about 3 weeks. His symptoms are increased with shortness of breath worse after exertion and lying down at night.  He is also having symptoms of seasonal allergic rhinitis that are only partially improved on daily loratadine and Flonase.  He is not having any trouble with shortness of breath at rest, fevers or chills, or chest pain from coughing. Plan: Prescription refill for Symbicort and Ventolin sent to be filled in about 3 weeks Provided Advair HFA sample in clinic today to avoid  lapse in treatment coverage given his history of severe exacerbations Recommended routine follow-up visit in approximately 3 months to assess symptom control  HIV disease (HCC) He has HIV well-controlled on [facility] with follow-up appointment scheduled in July with Dr. Daiva Maynard.   Past Medical History:  Diagnosis Date  . Allergy   . Asthma   . HIV disease (HCC) 09/19/2015  . Low blood sugar 01/06/2018  . Marijuana smoker 01/23/2017   Family History  Problem Relation Age of Onset  . Hypertension Mother   . Cancer Maternal Grandmother   . Cancer Paternal Grandfather      Review of Systems: Review of Systems  Constitutional: Negative for chills and fever.  HENT: Positive for congestion.   Eyes: Positive for redness.  Respiratory: Positive for cough, shortness of breath and wheezing.   Cardiovascular: Negative for chest pain.  Gastrointestinal: Negative for abdominal pain.  Genitourinary: Negative for dysuria.  Musculoskeletal: Negative for myalgias.  Skin: Negative for rash.  Neurological: Negative for dizziness and headaches.  Endo/Heme/Allergies: Positive for environmental allergies.  Psychiatric/Behavioral: The patient is not nervous/anxious.      Physical Exam: Vitals:   05/01/18 1340  BP: 130/68  Pulse: 88  Temp: 98.2 F (36.8 C)  TempSrc: Oral  SpO2: 98%  Weight: 166 lb 3.2 oz (75.4 kg)  Height:  (1.651 m)   GENERAL- alert, co-operative, NAD HEENT- Atraumatic, conjunctivae are mildly erythematous bilaterally CARDIAC- RRR, no murmurs, rubs or gallops. RESP- CTAB, no wheezes or crackles. ABDOMEN- Soft, nontender, no guarding or rebound BACK-  Normal curvature, no paraspinal tenderness, no CVA tenderness. NEURO- strength upper and lower extremities- 5/5, reflexes 2+ symmetrically EXTREMITIES- pulse 2+, symmetric, no pedal edema. SKIN- Warm, dry, No rash or lesion. PSYCH- Normal mood and affect, appropriate thought content and speech.   Assessment & Plan:     See encounters tab for problem based medical decision making.   Patient discussed with Dr. Heide Maynard

## 2018-05-01 NOTE — Assessment & Plan Note (Signed)
He has moderate to severe persistent asthma symptoms with a history of multiple exacerbations including hospitalization in 2017 requiring intubation for mechanical ventilation due to respiratory failure.  His symptoms were pretty well controlled on a regimen of Symbicort twice daily plus as needed albuterol however he is run out of medicine for the past 6 weeks.  This is been problematic since he lost insurance coverage is working on getting this established through his new job, which she expects will be in about 3 weeks. His symptoms are increased with shortness of breath worse after exertion and lying down at night.  He is also having symptoms of seasonal allergic rhinitis that are only partially improved on daily loratadine and Flonase.  He is not having any trouble with shortness of breath at rest, fevers or chills, or chest pain from coughing. Plan: Prescription refill for Symbicort and Ventolin sent to be filled in about 3 weeks Provided Advair HFA sample in clinic today to avoid lapse in treatment coverage given his history of severe exacerbations Recommended routine follow-up visit in approximately 3 months to assess symptom control

## 2018-05-01 NOTE — Assessment & Plan Note (Signed)
He has HIV well-controlled on Odefsey with follow-up appointment scheduled in July with Dr. Daiva Eves.

## 2018-06-24 ENCOUNTER — Other Ambulatory Visit: Payer: Self-pay

## 2018-06-24 ENCOUNTER — Ambulatory Visit: Payer: Self-pay

## 2018-06-24 ENCOUNTER — Ambulatory Visit: Payer: Self-pay | Admitting: Pharmacist

## 2018-06-24 DIAGNOSIS — B20 Human immunodeficiency virus [HIV] disease: Secondary | ICD-10-CM

## 2018-06-24 MED ORDER — LORATADINE 10 MG PO TABS: 10.0000 mg | ORAL_TABLET | Freq: Every day | ORAL | 11 refills | Status: AC

## 2018-06-24 MED ORDER — ALBUTEROL SULFATE HFA 108 (90 BASE) MCG/ACT IN AERS: INHALATION_SPRAY | RESPIRATORY_TRACT | 3 refills | Status: DC

## 2018-06-24 MED ORDER — MOMETASONE FURO-FORMOTEROL FUM 200-5 MCG/ACT IN AERO: 1.0000 | INHALATION_SPRAY | Freq: Two times a day (BID) | RESPIRATORY_TRACT | 3 refills | Status: DC

## 2018-06-24 MED ORDER — FLUTICASONE PROPIONATE 50 MCG/ACT NA SUSP: 1.0000 | Freq: Every day | NASAL | 3 refills | Status: DC

## 2018-06-24 NOTE — Progress Notes (Signed)
Patient requested help with inhaler access. Provided samples and [facility]. Patient advised to contact clinic if questions arise, he verbalized understanding.

## 2018-06-25 LAB — COMPLETE METABOLIC PANEL WITH GFR
AG RATIO: 1.6 (calc) (ref 1.0–2.5)
ALKALINE PHOSPHATASE (APISO): 64 U/L (ref 40–115)
ALT: 15 U/L (ref 9–46)
AST: 16 U/L (ref 10–40)
Albumin: 4.7 g/dL (ref 3.6–5.1)
BILIRUBIN TOTAL: 1 mg/dL (ref 0.2–1.2)
BUN: 14 mg/dL (ref 7–25)
CALCIUM: 9.6 mg/dL (ref 8.6–10.3)
CO2: 26 mmol/L (ref 20–32)
Chloride: 102 mmol/L (ref 98–110)
Creat: 1.13 mg/dL (ref 0.60–1.35)
GFR, Est African American: 101 mL/min/{1.73_m2} (ref >=60)
GFR, Est Non African American: 87 mL/min/{1.73_m2} (ref >=60)
Globulin: 2.9 g/dL (calc) (ref 1.9–3.7)
Glucose, Bld: 92 mg/dL (ref 65–99)
POTASSIUM: 4 mmol/L (ref 3.5–5.3)
SODIUM: 138 mmol/L (ref 135–146)
Total Protein: 7.6 g/dL (ref 6.1–8.1)

## 2018-06-25 LAB — LIPID PANEL
CHOLESTEROL: 172 mg/dL (ref <200)
HDL: 45 mg/dL (ref >40)
LDL Cholesterol (Calc): 107 mg/dL (calc) — ABNORMAL HIGH
Non-HDL Cholesterol (Calc): 127 mg/dL (calc) (ref <130)
Total CHOL/HDL Ratio: 3.8 (calc) (ref <5.0)
Triglycerides: 100 mg/dL (ref <150)

## 2018-06-25 LAB — CBC WITH DIFFERENTIAL/PLATELET
Basophils Absolute: 33 cells/uL (ref 0–200)
Basophils Relative: 0.5 %
EOS ABS: 540 {cells}/uL — AB (ref 15–500)
EOS PCT: 8.3 %
HCT: 49.3 % (ref 38.5–50.0)
HEMOGLOBIN: 16.7 g/dL (ref 13.2–17.1)
LYMPHS ABS: 2028 {cells}/uL (ref 850–3900)
MCH: 27.8 pg (ref 27.0–33.0)
MCHC: 33.9 g/dL (ref 32.0–36.0)
MCV: 82.2 fL (ref 80.0–100.0)
Monocytes Relative: 9.7 %
NEUTROS ABS: 3270 {cells}/uL (ref 1500–7800)
Neutrophils Relative %: 50.3 %
RBC: 6 10*6/uL — ABNORMAL HIGH (ref 4.20–5.80)
RDW: 13.6 % (ref 11.0–15.0)
Total Lymphocyte: 31.2 %
WBC mixed population: 631 cells/uL (ref 200–950)
WBC: 6.5 10*3/uL (ref 3.8–10.8)

## 2018-06-25 LAB — URINE CYTOLOGY ANCILLARY ONLY
Chlamydia: NEGATIVE
NEISSERIA GONORRHEA: NEGATIVE

## 2018-06-25 LAB — T-HELPER CELL (CD4) - (RCID CLINIC ONLY)
CD4 % Helper T Cell: 35 % (ref 33–55)
CD4 T CELL ABS: 790 /uL (ref 400–2700)

## 2018-06-25 LAB — RPR: RPR Ser Ql: NONREACTIVE

## 2018-06-27 LAB — HIV-1 RNA QUANT-NO REFLEX-BLD
HIV 1 RNA Quant: <20 copies/mL
HIV-1 RNA Quant, Log: <1.3 Log copies/mL

## 2018-07-08 ENCOUNTER — Ambulatory Visit: Payer: No Typology Code available for payment source | Admitting: Infectious Disease

## 2018-07-16 ENCOUNTER — Encounter: Payer: Self-pay | Admitting: Infectious Disease

## 2018-08-04 ENCOUNTER — Encounter: Payer: Self-pay | Admitting: Internal Medicine

## 2018-08-04 DIAGNOSIS — E785 Hyperlipidemia, unspecified: Secondary | ICD-10-CM | POA: Insufficient documentation

## 2018-09-10 ENCOUNTER — Ambulatory Visit (INDEPENDENT_AMBULATORY_CARE_PROVIDER_SITE_OTHER): Payer: Self-pay | Admitting: Family

## 2018-09-10 ENCOUNTER — Encounter: Payer: Self-pay | Admitting: Family

## 2018-09-10 VITALS — BP 122/78 | HR 60 | Temp 98.4°F | Ht 65.0 in | Wt 166.0 lb

## 2018-09-10 DIAGNOSIS — B20 Human immunodeficiency virus [HIV] disease: Secondary | ICD-10-CM

## 2018-09-10 DIAGNOSIS — Z23 Encounter for immunization: Secondary | ICD-10-CM

## 2018-09-10 MED ORDER — EMTRICITAB-RILPIVIR-TENOFOV AF 200-25-25 MG PO TABS: 1.0000 | ORAL_TABLET | Freq: Every day | ORAL | 5 refills | Status: DC

## 2018-09-10 NOTE — Patient Instructions (Signed)
Nice to meet you.  Please [facility] to [facility] your [facility] with a meal and avoid antacids.   Have a great 32th Birthday in a month.  Plan for follow up in 6 months or sooner if needed with blood work 1-2 weeks prior to your appointment.

## 2018-09-10 NOTE — Progress Notes (Signed)
Subjective:    Patient ID: [name], male    DOB: [birth year], 32 y.o.   MRN: [id number]  Chief Complaint  Patient presents with  . Follow-up    HIV disease     HPI:  [name] is a 32 y.o. male who presents today for routine follow up of HIV disease.  [name] was last seen in the office on 01/06/18 with a regimen of Odefsey and well controlled HIV. Most recent blood work completed on 06/24/18 shows a continued viral load that is undetectable and CD4 count of 790. Testing for Syphilis, Gonorrhea, or Chlamydia. Kidney function, liver function, and electrolytes are within the normal range.  [name] continues to take his [place] as prescribed and denies adverse side effects. He has not missed doses and has no problems obtaining his medication. UMAP has been renewed. Denies fevers, chills, night sweats, headaches, changes in vision, neck pain/stiffness, nausea, diarrhea, vomiting, lesions or rashes.    No Known Allergies    Outpatient Medications Prior to Visit  Medication Sig Dispense Refill  . albuterol (VENTOLIN HFA) 108 (90 Base) MCG/ACT inhaler INHALE 1 TO 2 PUFFS INTO THE LUNGS EVERY 6 HOURS AS NEEDED FOR WHEEZING OR SHORTNESS OF BREATH 54 g 3  . fluticasone (FLONASE) 50 MCG/ACT nasal spray Place 1-2 sprays into both nostrils daily. 3 g 3  . loratadine (CLARITIN) 10 MG tablet Take 1 tablet (10 mg total) by mouth daily. 90 tablet 11  . mometasone-formoterol (DULERA) 200-5 MCG/ACT AERO Inhale 1 puff into the lungs 2 (two) times daily. 3 Inhaler 3  . ODEFSEY 200-25-25 MG TABS tablet TAKE 1 TABLET BY MOUTH DAILY WITH BREAKFAST 30 tablet 3   No facility-administered medications prior to visit.      Past Medical History:  Diagnosis Date  . Allergy   . Asthma   . HIV disease (HCC) 09/19/2015  . Low blood sugar 01/06/2018  . Marijuana smoker 01/23/2017     History reviewed. No pertinent surgical history.     Review of Systems  Constitutional:  Negative for appetite change, chills, fatigue, fever and unexpected weight change.  Eyes: Negative for visual disturbance.  Respiratory: Negative for cough, chest tightness, shortness of breath and wheezing.   Cardiovascular: Negative for chest pain and leg swelling.  Gastrointestinal: Negative for abdominal pain, constipation, diarrhea, nausea and vomiting.  Genitourinary: Negative for dysuria, flank pain, frequency, genital sores, hematuria and urgency.  Skin: Negative for rash.  Allergic/Immunologic: Negative for immunocompromised state.  Neurological: Negative for dizziness and headaches.      Objective:    BP 122/78   Pulse 60   Temp 98.4 F (36.9 C)   Ht 5\' 5"  (1.651 m)   Wt 166 lb (75.3 kg)   BMI 27.62 kg/m  Nursing note and vital signs reviewed.  Physical Exam  Constitutional: He is oriented to person, place, and time. He appears well-developed. No distress.  HENT:  Mouth/Throat: Oropharynx is clear and moist.  Eyes: Conjunctivae are normal.  Neck: Neck supple.  Cardiovascular: Normal rate, regular rhythm, normal heart sounds and intact distal pulses. Exam reveals no gallop and no friction rub.  No murmur heard. Pulmonary/Chest: Effort normal and breath sounds normal. No respiratory distress. He has no wheezes. He has no rales. He exhibits no tenderness.  Abdominal: Soft. Bowel sounds are normal. He exhibits no distension and no mass. There is no tenderness. There is no guarding.  Lymphadenopathy:    He has no cervical adenopathy.  Neurological: He is alert and oriented to person, place, and time.  Skin: Skin is warm and dry. No rash noted.  Psychiatric: He has a normal mood and affect.       Assessment & Plan:   Problem List Items Addressed This Visit      Other   HIV disease (HCC) - Primary (Chronic)    [name] has well controlled HIV disease with a viral load that is undetectable and CD4 count of 790. He has no problems taking or obtaining his medication.  UMAP is up to date. He has no signs/symptoms of opportunistic infection through history or physical exam. Influenza updated today. Continue current dose of Odefsey. Encouraged to take with a meal and avoid antacids. Declines condoms. Follow up office visit in 6 months or sooner if needed with lab work 1-2 weeks prior to appointment.      Relevant Medications   emtricitabine-rilpivir-tenofovir AF (ODEFSEY) 200-25-25 MG TABS tablet   Other Relevant Orders   T-helper cell (CD4)- ([facility] clinic only)   HIV-1 RNA quant-no reflex-bld   Comprehensive metabolic panel   RPR   CBC    Other Visit Diagnoses    Need for immunization against influenza       Relevant Orders   Flu Vaccine QUAD 36+ mos IM (Completed)       I have changed [name]'s ODEFSEY to emtricitabine-rilpivir-tenofovir AF. I am also having him maintain his loratadine, albuterol, mometasone-formoterol, and fluticasone.   Meds ordered this encounter  Medications  . emtricitabine-rilpivir-tenofovir AF (ODEFSEY) 200-25-25 MG TABS tablet    Sig: Take 1 tablet by mouth daily with breakfast.    Dispense:  30 tablet    Refill:  5    Order Specific Question:   Supervising Provider    Answer:   Judyann Munson [[id number]]     Follow-up: Return in about 6 months (around 03/11/2019), or if symptoms worsen or fail to improve.   Marcos Eke, MSN, FNP-C Nurse Practitioner [facility] Office phone: [phone] Pager: [phone] [facility] Main number: [phone]

## 2018-09-10 NOTE — Assessment & Plan Note (Signed)
Mr. Eliseo GumBenton has well controlled HIV disease with a viral load that is undetectable and CD4 count of 790. He has no problems taking or obtaining his medication. UMAP is up to date. He has no signs/symptoms of opportunistic infection through history or physical exam. Influenza updated today. Continue current dose of Odefsey. Encouraged to take with a meal and avoid antacids. Declines condoms. Follow up office visit in 6 months or sooner if needed with lab work 1-2 weeks prior to appointment.

## 2018-10-28 ENCOUNTER — Other Ambulatory Visit: Payer: Self-pay | Admitting: Internal Medicine

## 2018-10-28 DIAGNOSIS — J4550 Severe persistent asthma, uncomplicated: Secondary | ICD-10-CM

## 2018-10-28 NOTE — Telephone Encounter (Signed)
Needs refill on albuterol (VENTOLIN HFA) 108 (90 Base) MCG/ACT inhaler at [facility] ;pt contact [phone]

## 2018-10-29 ENCOUNTER — Telehealth: Payer: Self-pay | Admitting: *Deleted

## 2018-10-29 DIAGNOSIS — J4550 Severe persistent asthma, uncomplicated: Secondary | ICD-10-CM

## 2018-10-29 MED ORDER — ALBUTEROL SULFATE HFA 108 (90 BASE) MCG/ACT IN AERS: INHALATION_SPRAY | RESPIRATORY_TRACT | 10 refills | Status: DC

## 2018-10-29 NOTE — Telephone Encounter (Signed)
Has appt PCP on 25th. Doesn't look like there is anything for me to do.

## 2018-10-29 NOTE — Telephone Encounter (Signed)
Pt calls about albuterol inhaler, states he has run out and wants "you all to give me some". He is advised that his script has been sent to [facility], that in order not to run out, he needs to make consistent appts with pcp and call for refills 5 days before running out, due to being mail order 10 days would be ideal. He states "ok, im coming to get my inhaler"

## 2018-10-29 NOTE — Telephone Encounter (Signed)
Refilled, sent to [place] [facility], was originally setup as [facility] but in message says [place] [facility].  If this is incorrect let me know

## 2018-10-29 NOTE — Telephone Encounter (Signed)
Sent a script to [facility] on [address] as well if he would like to pay for an inhaler there

## 2018-10-30 ENCOUNTER — Encounter: Payer: Self-pay | Admitting: Infectious Disease

## 2018-11-17 ENCOUNTER — Encounter: Payer: Self-pay | Admitting: Internal Medicine
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
## 2019-01-22 ENCOUNTER — Encounter (HOSPITAL_COMMUNITY): Payer: Self-pay

## 2019-01-22 ENCOUNTER — Emergency Department (HOSPITAL_COMMUNITY): Payer: Self-pay

## 2019-01-22 ENCOUNTER — Emergency Department (HOSPITAL_COMMUNITY)
Admission: EM | Admit: 2019-01-22 | Discharge: 2019-01-22 | Disposition: A | Discharge status: Home / Self Care | Payer: Self-pay | Attending: Emergency Medicine | Admitting: Emergency Medicine

## 2019-01-22 DIAGNOSIS — Z79899 Other long term (current) drug therapy: Secondary | ICD-10-CM | POA: Insufficient documentation

## 2019-01-22 DIAGNOSIS — J4541 Moderate persistent asthma with (acute) exacerbation: Secondary | ICD-10-CM | POA: Insufficient documentation

## 2019-01-22 LAB — BASIC METABOLIC PANEL
ANION GAP: 11 (ref 5–15)
BUN: 8 mg/dL (ref 6–20)
CO2: 24 mmol/L (ref 22–32)
Calcium: 9.2 mg/dL (ref 8.9–10.3)
Chloride: 103 mmol/L (ref 98–111)
Creatinine, Ser: 1.33 mg/dL — ABNORMAL HIGH (ref 0.61–1.24)
GFR calc Af Amer: >60 mL/min (ref >60)
GFR calc non Af Amer: >60 mL/min (ref >60)
Glucose, Bld: 78 mg/dL (ref 70–99)
Potassium: 3.6 mmol/L (ref 3.5–5.1)
Sodium: 138 mmol/L (ref 135–145)

## 2019-01-22 LAB — I-STAT TROPONIN, ED: Troponin i, poc: 0 ng/mL (ref 0.00–0.08)

## 2019-01-22 LAB — CBC WITH DIFFERENTIAL/PLATELET
Abs Immature Granulocytes: 0.02 10*3/uL (ref 0.00–0.07)
Basophils Absolute: 0 10*3/uL (ref 0.0–0.1)
Basophils Relative: 0 %
EOS ABS: 0.3 10*3/uL (ref 0.0–0.5)
Eosinophils Relative: 4 %
HCT: 50.1 % (ref 39.0–52.0)
Hemoglobin: 15.9 g/dL (ref 13.0–17.0)
IMMATURE GRANULOCYTES: 0 %
Lymphocytes Relative: 13 %
Lymphs Abs: 0.8 10*3/uL (ref 0.7–4.0)
MCH: 27.9 pg (ref 26.0–34.0)
MCHC: 31.7 g/dL (ref 30.0–36.0)
MCV: 88 fL (ref 80.0–100.0)
Monocytes Absolute: 0.8 10*3/uL (ref 0.1–1.0)
Monocytes Relative: 14 %
Neutro Abs: 4 10*3/uL (ref 1.7–7.7)
Neutrophils Relative %: 69 %
Platelets: 132 10*3/uL — ABNORMAL LOW (ref 150–400)
RBC: 5.69 MIL/uL (ref 4.22–5.81)
RDW: 14.1 % (ref 11.5–15.5)
WBC: 5.9 10*3/uL (ref 4.0–10.5)
nRBC: 0 % (ref 0.0–0.2)

## 2019-01-22 LAB — BRAIN NATRIURETIC PEPTIDE: B NATRIURETIC PEPTIDE 5: 25.1 pg/mL (ref 0.0–100.0)

## 2019-01-22 MED ORDER — ACETAMINOPHEN 325 MG PO TABS
650.0000 mg | ORAL_TABLET | Freq: Once | ORAL | Status: AC
  Administered 2019-01-22: 650 mg via ORAL
  Filled 2019-01-22: qty 2

## 2019-01-22 MED ORDER — PREDNISONE 20 MG PO TABS
60.0000 mg | ORAL_TABLET | Freq: Once | ORAL | Status: AC
  Administered 2019-01-22: 60 mg via ORAL
  Filled 2019-01-22: qty 3

## 2019-01-22 MED ORDER — ALBUTEROL SULFATE (2.5 MG/3ML) 0.083% IN NEBU
5.0000 mg | INHALATION_SOLUTION | Freq: Once | RESPIRATORY_TRACT | Status: AC
  Administered 2019-01-22: 5 mg via RESPIRATORY_TRACT
  Filled 2019-01-22: qty 6

## 2019-01-22 MED ORDER — PREDNISONE 20 MG PO TABS: ORAL_TABLET | ORAL | 0 refills | Status: DC

## 2019-01-22 MED ORDER — IPRATROPIUM-ALBUTEROL 0.5-2.5 (3) MG/3ML IN SOLN
3.0000 mL | Freq: Once | RESPIRATORY_TRACT | Status: AC
  Administered 2019-01-22: 3 mL via RESPIRATORY_TRACT
  Filled 2019-01-22: qty 3

## 2019-01-22 MED ORDER — ALBUTEROL (5 MG/ML) CONTINUOUS INHALATION SOLN
10.0000 mg/h | INHALATION_SOLUTION | Freq: Once | RESPIRATORY_TRACT | Status: AC
  Administered 2019-01-22: 10 mg/h via RESPIRATORY_TRACT
  Filled 2019-01-22: qty 20

## 2019-01-22 MED ORDER — BENZONATATE 100 MG PO CAPS: 100.0000 mg | ORAL_CAPSULE | Freq: Three times a day (TID) | ORAL | 0 refills | Status: DC

## 2019-01-22 NOTE — ED Provider Notes (Signed)
[facility] EM[facility]ENCY [facility] Provider Note   CSN: [id number] Arrival date & time: 01/22/19  1732     History   Chief Complaint Chief Complaint  Patient presents with  . Asthma    HPI [name] is a 33 y.o. male.  The history is provided by the patient. No language interpreter was used.  Asthma      33 year old male with history of HIV, asthma, allergy presenting for complaints of shortness of breath.  Patient report he recently got over a cold 3 days ago.  However due to his cold, he endorsed an asthma exacerbation which is usual for him after a cold.  For the past 3 days he has had increased shortness of breath, and wheezing.  His cold symptoms mostly resolved.  He denies any active fever or congestion.  He has been using his nebulizer and rescue inhaler more than usual.  Today he uses inhaler on an hourly basis.  He did go to walk right after his shift he went here requesting for additional help with his breathing.  Denies any active cough or hemoptysis.  Past Medical History:  Diagnosis Date  . Allergy   . Asthma   . HIV disease (HCC) 09/19/2015  . Low blood sugar 01/06/2018  . Marijuana smoker 01/23/2017    Patient Active Problem List   Diagnosis Date Noted  . Hyperlipidemia 08/04/2018  . Marijuana smoker 01/23/2017  . HIV disease (HCC) 09/19/2015  . Severe asthma 04/27/2015  . HPV (human papilloma virus) anogenital infection 01/09/2013  . Seasonal allergies 04/18/2012  . ACNE, MILD 08/10/2008    History reviewed. No pertinent surgical history.      Home Medications    Prior to Admission medications   Medication Sig Start Date End Date Taking? Authorizing Provider  albuterol (VENTOLIN HFA) 108 (90 Base) MCG/ACT inhaler INHALE 1 TO 2 PUFFS INTO THE LUNGS EVERY 6 HOURS AS NEEDED FOR WHEEZING OR SHORTNESS OF BREATH 10/29/18   Angelita InglesWinfrey, William B, MD  emtricitabine-rilpivir-tenofovir AF (ODEFSEY) 200-25-25 MG TABS tablet Take 1 tablet  by mouth daily with breakfast. 09/10/18   Veryl Speakalone, Gregory D, FNP  fluticasone (FLONASE) 50 MCG/ACT nasal spray Place 1-2 sprays into both nostrils daily. 06/24/18 06/24/19  Angelita InglesWinfrey, William B, MD  loratadine (CLARITIN) 10 MG tablet Take 1 tablet (10 mg total) by mouth daily. 06/24/18   Angelita InglesWinfrey, William B, MD  mometasone-formoterol (DULERA) 200-5 MCG/ACT AERO Inhale 1 puff into the lungs 2 (two) times daily. 06/24/18   Angelita InglesWinfrey, William B, MD    Family History Family History  Problem Relation Age of Onset  . Hypertension Mother   . Cancer Maternal Grandmother   . Cancer Paternal Grandfather     Social History Social History   Tobacco Use  . Smoking status: Never Smoker  . Smokeless tobacco: Never Used  Substance Use Topics  . Alcohol use: No    Alcohol/week: 0.0 standard drinks  . Drug use: No     Allergies   Patient has no known allergies.   Review of Systems Review of Systems  All other systems reviewed and are negative.    Physical Exam Updated Vital Signs BP (!) 148/87 (BP Location: Right Arm)   Pulse 98   Temp 98.3 F (36.8 C) (Oral)   Resp 18   SpO2 98%   Physical Exam Vitals signs and nursing note reviewed.  Constitutional:      General: He is not in acute distress.    Appearance:  He is well-developed.  HENT:     Head: Atraumatic.     Nose: Nose normal.     Mouth/Throat:     Mouth: Mucous membranes are moist.  Eyes:     Conjunctiva/sclera: Conjunctivae normal.  Neck:     Musculoskeletal: Normal range of motion and neck supple. No neck rigidity.  Cardiovascular:     Rate and Rhythm: Normal rate and regular rhythm.  Pulmonary:     Breath sounds: Wheezing present. No rhonchi or rales.  Abdominal:     Palpations: Abdomen is soft.     Tenderness: There is no abdominal tenderness.  Musculoskeletal:        General: No swelling.  Lymphadenopathy:     Cervical: No cervical adenopathy.  Skin:    Findings: No rash.  Neurological:     Mental Status: He is  alert and oriented to person, place, and time.      ED Treatments / Results  Labs (all labs ordered are listed, but only abnormal results are displayed) Labs Reviewed  BASIC METABOLIC PANEL - Abnormal; Notable for the following components:      Result Value   Creatinine, Ser 1.33 (*)    All other components within normal limits  CBC WITH DIFFERENTIAL/PLATELET - Abnormal; Notable for the following components:   Platelets 132 (*)    All other components within normal limits  BRAIN NATRIURETIC PEPTIDE  I-STAT TROPONIN, ED    EKG None  Radiology Dg Chest 2 View  Result Date: 01/22/2019 CLINICAL DATA:  Shortness of breath for 2 days. EXAM: CHEST - 2 VIEW COMPARISON:  May 02, 2016 FINDINGS: The heart size and mediastinal contours are within normal limits. Both lungs are clear. The visualized skeletal structures are unremarkable. IMPRESSION: No active cardiopulmonary disease. Electronically Signed   By: Gerome Samavid  Williams III M.D   On: 01/22/2019 20:38    Procedures .Critical Care Performed by: Fayrene Helperran, Chiara Coltrin, PA-C Authorized by: Fayrene Helperran, Glen Kesinger, PA-C   Critical care provider statement:    Critical care time (minutes):  45   Critical care was time spent personally by me on the following activities:  Discussions with consultants, evaluation of patient's response to treatment, examination of patient, ordering and performing treatments and interventions, ordering and review of laboratory studies, ordering and review of radiographic studies, pulse oximetry, re-evaluation of patient's condition, obtaining history from patient or surrogate and review of old charts   (including critical care time)  Medications Ordered in ED Medications  albuterol (PROVENTIL) (2.5 MG/3ML) 0.083% nebulizer solution 5 mg (5 mg Nebulization Given 01/22/19 1800)  ipratropium-albuterol (DUONEB) 0.5-2.5 (3) MG/3ML nebulizer solution 3 mL (3 mLs Nebulization Given 01/22/19 2040)  predniSONE (DELTASONE) tablet 60 mg (60 mg  Oral Given 01/22/19 2039)  acetaminophen (TYLENOL) tablet 650 mg (650 mg Oral Given 01/22/19 2113)  albuterol (PROVENTIL,VENTOLIN) solution continuous neb (10 mg/hr Nebulization Given 01/22/19 2115)     Initial Impression / Assessment and Plan / ED Course  I have reviewed the triage vital signs and the nursing notes.  Pertinent labs & imaging results that were available during my care of the patient were reviewed by me and considered in my medical decision making (see chart for details).     BP (!) 148/83   Pulse 86   Temp 98.3 F (36.8 C) (Oral)   Resp 13   SpO2 100%    Final Clinical Impressions(s) / ED Diagnoses   Final diagnoses:  Moderate persistent asthma with acute exacerbation    ED  Discharge Orders         Ordered    predniSONE (DELTASONE) 20 MG tablet     01/22/19 2156    benzonatate (TESSALON) 100 MG capsule  Every 8 hours     01/22/19 2156         8:26 PM Patient here with asthma exacerbation after cold symptoms several days prior.  He does have both inspiratory and expiratory wheezes with prolonged expiratory phase without any accessory muscle use.  He is otherwise well-appearing, vital signs stable.  Will give prednisone, duo nebs, and will monitor.  9:38 PM Initial 2 duo nebs with mild improvement.  Patient placed on continuous nebulizer and there is an improvement of his lung aeration on reexamination.  Patient felt better.  9:57 PM Labs are reassuring.  Pt at this time felt better and stable for discharge.  Pt d/c with prednisone, tessalon.  He will continue with his rescue inhaler and nebs at home.    Fayrene Helper, PA-C 01/22/19 2157    Azalia Bilis, MD 01/22/19 2250

## 2019-01-22 NOTE — ED Notes (Signed)
ED Provider at bedside. 

## 2019-01-22 NOTE — ED Notes (Signed)
Patient transported to X-ray 

## 2019-01-22 NOTE — ED Triage Notes (Signed)
Pt arrives POV for eval of asthma exacerbation. Pt reports trying nebs, inhalers at home w/ no relief. Audible wheezing in all fields on exam. NO acute distress sats 98% on RA.

## 2019-01-22 NOTE — ED Notes (Signed)
Patient verbalizes understanding of discharge instructions. Opportunity for questioning and answers were provided. Armband removed by staff, pt discharged from ED ambulatory.   

## 2019-02-09 ENCOUNTER — Other Ambulatory Visit: Payer: Self-pay

## 2019-02-09 DIAGNOSIS — B20 Human immunodeficiency virus [HIV] disease: Secondary | ICD-10-CM

## 2019-02-09 NOTE — Addendum Note (Signed)
Addended byJimmy Picket F on: 02/09/2019 02:31 PM   Modules accepted: Orders

## 2019-02-10 ENCOUNTER — Encounter: Payer: Self-pay | Admitting: Infectious Disease

## 2019-02-10 LAB — URINE CYTOLOGY ANCILLARY ONLY
Chlamydia: NEGATIVE
NEISSERIA GONORRHEA: NEGATIVE

## 2019-02-10 LAB — T-HELPER CELL (CD4) - (RCID CLINIC ONLY)
CD4 % Helper T Cell: 31 % — ABNORMAL LOW (ref 33–55)
CD4 T Cell Abs: 510 /uL (ref 400–2700)

## 2019-02-11 LAB — CBC WITH DIFFERENTIAL/PLATELET
Absolute Monocytes: 816 cells/uL (ref 200–950)
Basophils Absolute: 31 cells/uL (ref 0–200)
Basophils Relative: 0.4 %
Eosinophils Absolute: 323 cells/uL (ref 15–500)
Eosinophils Relative: 4.2 %
HCT: 43.9 % (ref 38.5–50.0)
HEMOGLOBIN: 15.1 g/dL (ref 13.2–17.1)
Lymphs Abs: 1548 cells/uL (ref 850–3900)
MCH: 28.5 pg (ref 27.0–33.0)
MCHC: 34.4 g/dL (ref 32.0–36.0)
MCV: 82.8 fL (ref 80.0–100.0)
MPV: 10.6 fL (ref 7.5–12.5)
Monocytes Relative: 10.6 %
Neutro Abs: 4982 cells/uL (ref 1500–7800)
Neutrophils Relative %: 64.7 %
Platelets: 170 10*3/uL (ref 140–400)
RBC: 5.3 10*6/uL (ref 4.20–5.80)
RDW: 13.1 % (ref 11.0–15.0)
Total Lymphocyte: 20.1 %
WBC: 7.7 10*3/uL (ref 3.8–10.8)

## 2019-02-11 LAB — HIV-1 RNA QUANT-NO REFLEX-BLD
HIV 1 RNA QUANT: NOT DETECTED {copies}/mL
HIV-1 RNA Quant, Log: <1.3 Log copies/mL

## 2019-02-11 LAB — COMPREHENSIVE METABOLIC PANEL
AG RATIO: 1.6 (calc) (ref 1.0–2.5)
ALT: 15 U/L (ref 9–46)
AST: 15 U/L (ref 10–40)
Albumin: 4.1 g/dL (ref 3.6–5.1)
Alkaline phosphatase (APISO): 49 U/L (ref 36–130)
BILIRUBIN TOTAL: 0.7 mg/dL (ref 0.2–1.2)
BUN: 9 mg/dL (ref 7–25)
CO2: 29 mmol/L (ref 20–32)
Calcium: 9.2 mg/dL (ref 8.6–10.3)
Chloride: 107 mmol/L (ref 98–110)
Creat: 1.18 mg/dL (ref 0.60–1.35)
Globulin: 2.5 g/dL (calc) (ref 1.9–3.7)
Glucose, Bld: 90 mg/dL (ref 65–99)
Potassium: 4 mmol/L (ref 3.5–5.3)
Sodium: 142 mmol/L (ref 135–146)
Total Protein: 6.6 g/dL (ref 6.1–8.1)

## 2019-02-11 LAB — RPR: RPR Ser Ql: NONREACTIVE

## 2019-02-25 ENCOUNTER — Other Ambulatory Visit: Payer: Self-pay

## 2019-03-02 IMAGING — CR DG CHEST 2V
2 series · 2 of 2 positions shown · non-contrast
Comparison: May 02, 2016

CLINICAL DATA: Shortness of breath for 2 days.

EXAM:
CHEST - 2 VIEW

[chest pa]
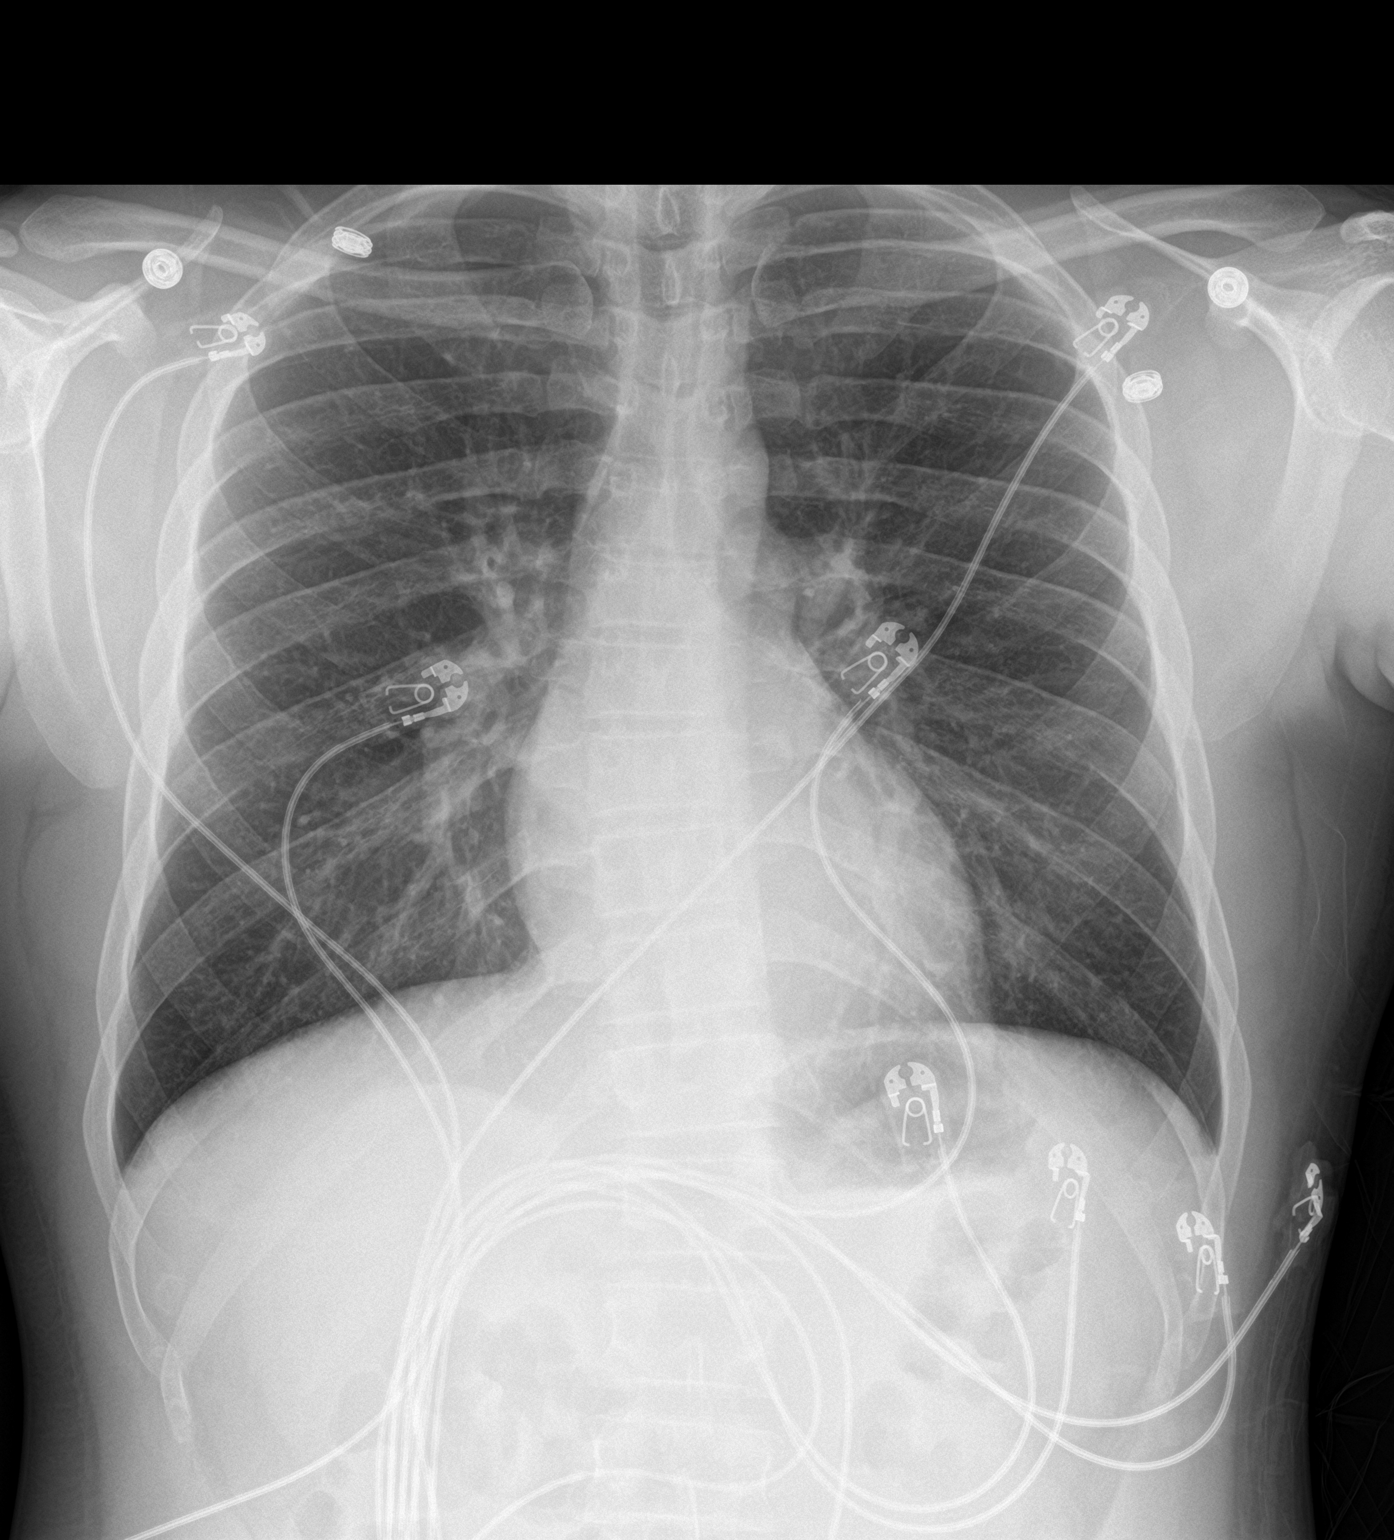

[chest lat]
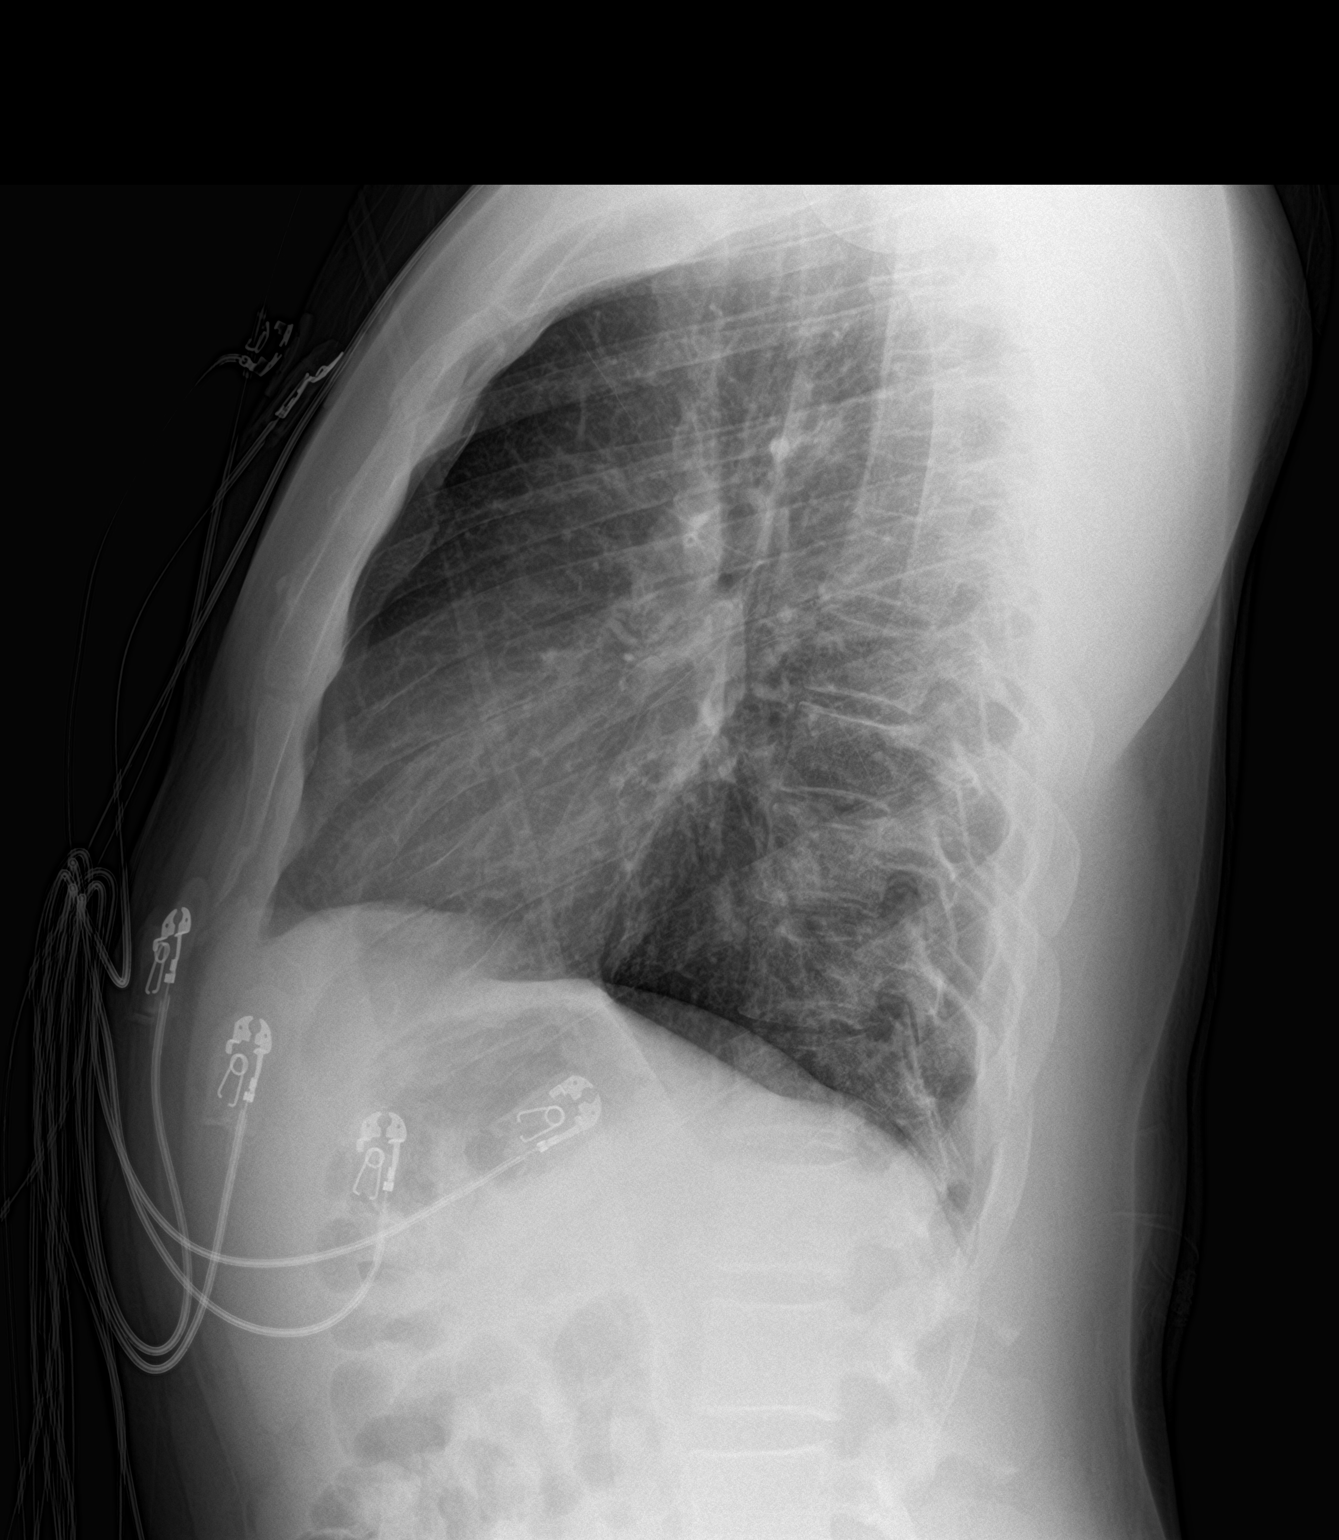

[2 of 2 positions shown; findings below may reference images not displayed]

FINDINGS: The heart size and mediastinal contours are within normal limits.
Both lungs are clear. The visualized skeletal structures are
unremarkable.
IMPRESSION: No active cardiopulmonary disease.

## 2019-03-11 ENCOUNTER — Encounter: Payer: Self-pay | Admitting: Infectious Disease

## 2019-03-11 ENCOUNTER — Other Ambulatory Visit: Payer: Self-pay

## 2019-03-11 ENCOUNTER — Ambulatory Visit (INDEPENDENT_AMBULATORY_CARE_PROVIDER_SITE_OTHER): Payer: Self-pay | Admitting: Infectious Disease

## 2019-03-11 VITALS — Ht 62.0 in | Wt 160.0 lb

## 2019-03-11 DIAGNOSIS — B20 Human immunodeficiency virus [HIV] disease: Secondary | ICD-10-CM

## 2019-03-11 DIAGNOSIS — E7800 Pure hypercholesterolemia, unspecified: Secondary | ICD-10-CM

## 2019-03-11 NOTE — Progress Notes (Signed)
Subjective:  Chief complaint: followup for HIV on meds   Patient ID: [name], male    DOB: 1986-03-05, 33 y.o.   MRN: [id number]  HPI  33 year old [place] American man who was put on therapy shortly after being diagnosed with HIV.  We opted to go with ODEFSEY and he has suppressed nicely on this regimen.   He is currently been spending most of his time in [place] where he is working but he is going to be back in [place] in roughly 9 months time.  He is on the HIV medication assistance program but will require insurance in the next 90 days.   Past Medical History:  Diagnosis Date  . Allergy   . Asthma   . HIV disease (HCC) 09/19/2015  . Low blood sugar 01/06/2018  . Marijuana smoker 01/23/2017    No past surgical history on file.  Family History  Problem Relation Age of Onset  . Hypertension Mother   . Cancer Maternal Grandmother   . Cancer Paternal Grandfather       reports that he has never smoked. He has never used smokeless tobacco. He reports that he does not drink alcohol or use drugs.   Family History  Problem Relation Age of Onset  . Hypertension Mother   . Cancer Maternal Grandmother   . Cancer Paternal Grandfather     No Known Allergies   Current Outpatient Medications:  .  albuterol (VENTOLIN HFA) 108 (90 Base) MCG/ACT inhaler, INHALE 1 TO 2 PUFFS INTO THE LUNGS EVERY 6 HOURS AS NEEDED FOR WHEEZING OR SHORTNESS OF BREATH (Patient taking differently: Inhale 1-2 puffs into the lungs every 6 (six) hours as needed for wheezing or shortness of breath. ), Disp: 18 g, Rfl: 10 .  benzonatate (TESSALON) 100 MG capsule, Take 1 capsule (100 mg total) by mouth every 8 (eight) hours., Disp: 21 capsule, Rfl: 0 .  emtricitabine-rilpivir-tenofovir AF (ODEFSEY) 200-25-25 MG TABS tablet, Take 1 tablet by mouth daily with breakfast., Disp: 30 tablet, Rfl: 5 .  fluticasone (FLONASE) 50 MCG/ACT nasal spray, Place 1-2 sprays into both nostrils daily., Disp: 3 g,  Rfl: 3 .  loratadine (CLARITIN) 10 MG tablet, Take 1 tablet (10 mg total) by mouth daily., Disp: 90 tablet, Rfl: 11 .  mometasone-formoterol (DULERA) 200-5 MCG/ACT AERO, Inhale 1 puff into the lungs 2 (two) times daily., Disp: 3 Inhaler, Rfl: 3 .  predniSONE (DELTASONE) 20 MG tablet, 3 tabs po day one, then 2 tabs daily x 4 days (Patient not taking: Reported on 03/11/2019), Disp: 11 tablet, Rfl: 0   Review of Systems  Constitutional: Negative for activity change, appetite change, chills, diaphoresis, fatigue, fever and unexpected weight change.  HENT: Negative for congestion, rhinorrhea, sinus pressure, sneezing, sore throat and trouble swallowing.   Eyes: Negative for photophobia and visual disturbance.  Respiratory: Negative for cough, chest tightness, shortness of breath, wheezing and stridor.   Cardiovascular: Negative for chest pain, palpitations and leg swelling.  Gastrointestinal: Negative for abdominal distention, abdominal pain, anal bleeding, blood in stool, constipation, diarrhea, nausea and vomiting.  Genitourinary: Negative for difficulty urinating, dysuria, flank pain and hematuria.  Musculoskeletal: Negative for arthralgias, back pain, gait problem, joint swelling and myalgias.  Skin: Negative for color change, pallor, rash and wound.  Neurological: Negative for dizziness, tremors, weakness and light-headedness.  Hematological: Negative for adenopathy. Does not bruise/bleed easily.  Psychiatric/Behavioral: Negative for agitation, behavioral problems, confusion, decreased concentration, dysphoric mood and sleep disturbance.  Objective:   Physical Exam  Constitutional: He is oriented to person, place, and time. He appears well-developed and well-nourished.  HENT:  Head: Normocephalic and atraumatic.  Eyes: Conjunctivae and EOM are normal.  Neck: Normal range of motion. Neck supple.  Cardiovascular: Normal rate and regular rhythm.  Pulmonary/Chest: Effort normal. No  respiratory distress. He has no wheezes.  Abdominal: Soft. He exhibits no distension.  Musculoskeletal: Normal range of motion.        General: No tenderness or edema.  Neurological: He is alert and oriented to person, place, and time.  Skin: Skin is warm and dry. No rash noted. No erythema. No pallor.  Psychiatric: He has a normal mood and affect. His behavior is normal. Judgment and thought content normal.  Nursing note and vitals reviewed.         Assessment & Plan:  HIV disease: continue ODEFSEY with chewed  Meal . I reviewed all of his relevant labs for him

## 2019-06-24 ENCOUNTER — Other Ambulatory Visit: Payer: Self-pay | Admitting: Family

## 2019-06-24 DIAGNOSIS — B20 Human immunodeficiency virus [HIV] disease: Secondary | ICD-10-CM

## 2019-07-06 ENCOUNTER — Other Ambulatory Visit: Payer: Self-pay | Admitting: Internal Medicine

## 2019-07-06 DIAGNOSIS — J45909 Unspecified asthma, uncomplicated: Secondary | ICD-10-CM

## 2019-07-06 DIAGNOSIS — J4550 Severe persistent asthma, uncomplicated: Secondary | ICD-10-CM

## 2019-07-06 NOTE — Telephone Encounter (Signed)
Received refill request from pt's pharmacy-Last office visit 05/01/2018-call made to patient to inform him that he needs a visit.  Appointment scheduled for 7/14 with his pcp-pt agreed to appt.Marland KitchenMarland KitchenDespina Hidden Cassady7/13/202010:42 AM

## 2019-07-07 ENCOUNTER — Ambulatory Visit (INDEPENDENT_AMBULATORY_CARE_PROVIDER_SITE_OTHER): Payer: Self-pay | Admitting: Internal Medicine

## 2019-07-07 ENCOUNTER — Encounter: Payer: Self-pay | Admitting: Internal Medicine

## 2019-07-07 ENCOUNTER — Ambulatory Visit: Payer: Self-pay

## 2019-07-07 ENCOUNTER — Other Ambulatory Visit: Payer: Self-pay

## 2019-07-07 DIAGNOSIS — Z7951 Long term (current) use of inhaled steroids: Secondary | ICD-10-CM

## 2019-07-07 DIAGNOSIS — J4550 Severe persistent asthma, uncomplicated: Secondary | ICD-10-CM

## 2019-07-07 DIAGNOSIS — Z79899 Other long term (current) drug therapy: Secondary | ICD-10-CM

## 2019-07-07 DIAGNOSIS — J302 Other seasonal allergic rhinitis: Secondary | ICD-10-CM

## 2019-07-07 DIAGNOSIS — J4540 Moderate persistent asthma, uncomplicated: Secondary | ICD-10-CM

## 2019-07-07 MED ORDER — ALBUTEROL SULFATE HFA 108 (90 BASE) MCG/ACT IN AERS: 1.0000 | INHALATION_SPRAY | Freq: Four times a day (QID) | RESPIRATORY_TRACT | 3 refills | Status: DC | PRN

## 2019-07-07 MED ORDER — FLUTICASONE PROPIONATE 50 MCG/ACT NA SUSP: 1.0000 | Freq: Every day | NASAL | 3 refills | Status: DC

## 2019-07-07 MED ORDER — DULERA 200-5 MCG/ACT IN AERO: 1.0000 | INHALATION_SPRAY | Freq: Two times a day (BID) | RESPIRATORY_TRACT | 3 refills | Status: DC

## 2019-07-07 NOTE — Progress Notes (Signed)
   CC: follow up on moderate persistent asthma  HPI:  [name] is a 33 y.o. male with PMH below.  Today we will address moderate persistent asthma  Please see A&P for status of the patient's chronic medical conditions  Past Medical History:  Diagnosis Date  . Allergy   . Asthma   . HIV disease ([place]) 09/19/2015  . Low blood sugar 01/06/2018  . Marijuana smoker 01/23/2017   Review of Systems: ROS: Pulmonary: pt denies increased work of breathing, shortness of breath,  Cardiac: pt denies palpitations, chest pain,  Abdominal: pt denies abdominal pain, nausea, vomiting, or diarrhea   Physical Exam:  Vitals:   07/07/19 1501 07/07/19 1544  BP: (!) 154/95 131/73  Pulse: 98 82  SpO2: 98%    Cardiac: JVD flat, normal rate and rhythm, clear s1 and s2, no murmurs, rubs or gallops, no LE edema Pulmonary: CTAB, not in distress Abdominal: non distended abdomen, soft and nontender Psych: Alert, conversant, in good spirits   Social History   Socioeconomic History  . Marital status: Single    Spouse name: Not on file  . Number of children: Not on file  . Years of education: Not on file  . Highest education level: Not on file  Occupational History  . Not on file  Social Needs  . Financial resource strain: Not on file  . Food insecurity    Worry: Not on file    Inability: Not on file  . Transportation needs    Medical: Not on file    Non-medical: Not on file  Tobacco Use  . Smoking status: Never Smoker  . Smokeless tobacco: Never Used  Substance and Sexual Activity  . Alcohol use: No    Alcohol/week: 0.0 standard drinks  . Drug use: No  . Sexual activity: Yes    Partners: Male    Birth control/protection: Condom    Comment: declined condoms  Lifestyle  . Physical activity    Days per week: Not on file    Minutes per session: Not on file  . Stress: Not on file  Relationships  . Social Herbalist on phone: Not on file    Gets together: Not on  file    Attends religious service: Not on file    Active member of club or organization: Not on file    Attends meetings of clubs or organizations: Not on file    Relationship status: Not on file  . Intimate partner violence    Fear of current or ex partner: Not on file    Emotionally abused: Not on file    Physically abused: Not on file    Forced sexual activity: Not on file  Other Topics Concern  . Not on file  Social History Narrative   Social history: Has sex with both men and women. Almost always uses a condom. Has had sexual contact with a man who has syphilis however he was using protection.   His family notes that he is out and are okay with it.  He is relieved about that.    Family History  Problem Relation Age of Onset  . Hypertension Mother   . Cancer Maternal Grandmother   . Cancer Paternal Grandfather     Assessment & Plan:   See Encounters Tab for problem based charting.  Patient discussed with Dr. Angelia Maynard

## 2019-07-07 NOTE — Patient Instructions (Signed)
[name] I have given you a sample of BREO to replace your dulera until you get this refilled by [facility].  I have also replaced your albuterol.  I will see you in about 6 months or sooner if you need for follow up.

## 2019-07-08 NOTE — Assessment & Plan Note (Signed)
Sleeping well, no night time awakenings.  Didn't need albuterol from December to June but with the weather change he has had to start using it especially in the mornings while outside after going for a walk.  This also corresponds to running out of his dulera about one month ago.  Uses it 1-2 times per day usually after exercise or being outside.  He takes zyrtec for allergy symptoms does have nasal congestion.  His symptoms have been well controlled in the past with daily ICS/LABA therapy.  He has not been able to receive his dulera through [facility] until this visit.    -gave pt sample of BREO as a bridge to getting his Ruthe Mannan -also given albuterol sample for PRN therapy  -he will purchase over the counter flonase as this is not on [facility] formulary

## 2019-07-09 NOTE — Assessment & Plan Note (Signed)
Pt requires refills on medications with associated diagnosis above.  Reviewed disease process and find this medication to be necessary, will not change dose or alter current therapy. 

## 2019-07-09 NOTE — Progress Notes (Signed)
Internal Medicine Clinic Attending  Case discussed with Dr. Winfrey  at the time of the visit.  We reviewed the resident's history and exam and pertinent patient test results.  I agree with the assessment, diagnosis, and plan of care documented in the resident's note.  

## 2019-07-16 ENCOUNTER — Encounter: Payer: Self-pay | Admitting: Infectious Disease

## 2019-07-22 ENCOUNTER — Encounter: Payer: Self-pay | Admitting: Internal Medicine

## 2019-07-22 ENCOUNTER — Ambulatory Visit: Payer: Self-pay

## 2019-07-23 ENCOUNTER — Encounter: Payer: Self-pay | Admitting: Internal Medicine

## 2019-11-26 ENCOUNTER — Other Ambulatory Visit: Payer: Self-pay | Admitting: Family

## 2019-11-26 DIAGNOSIS — B20 Human immunodeficiency virus [HIV] disease: Secondary | ICD-10-CM

## 2020-02-10 ENCOUNTER — Other Ambulatory Visit: Payer: Self-pay | Admitting: Internal Medicine

## 2020-02-10 DIAGNOSIS — J4550 Severe persistent asthma, uncomplicated: Secondary | ICD-10-CM

## 2020-03-03 NOTE — Addendum Note (Signed)
Addended by: Mariea Clonts D on: 03/03/2020 10:52 AM   Modules accepted: Orders

## 2020-03-10 ENCOUNTER — Other Ambulatory Visit: Payer: Self-pay

## 2020-03-10 DIAGNOSIS — B20 Human immunodeficiency virus [HIV] disease: Secondary | ICD-10-CM

## 2020-03-11 LAB — URINE CYTOLOGY ANCILLARY ONLY
Chlamydia: NEGATIVE
Comment: NEGATIVE
Comment: NORMAL
Neisseria Gonorrhea: NEGATIVE

## 2020-03-11 LAB — T-HELPER CELL (CD4) - (RCID CLINIC ONLY)
CD4 % Helper T Cell: 38 % (ref 33–65)
CD4 T Cell Abs: 721 /uL (ref 400–1790)

## 2020-03-12 LAB — CBC WITH DIFFERENTIAL/PLATELET
Absolute Monocytes: 568 cells/uL (ref 200–950)
Basophils Absolute: 40 cells/uL (ref 0–200)
Basophils Relative: 0.6 %
Eosinophils Absolute: 231 cells/uL (ref 15–500)
Eosinophils Relative: 3.5 %
HCT: 44.2 % (ref 38.5–50.0)
Hemoglobin: 14.5 g/dL (ref 13.2–17.1)
Lymphs Abs: 1973 cells/uL (ref 850–3900)
MCH: 28.3 pg (ref 27.0–33.0)
MCHC: 32.8 g/dL (ref 32.0–36.0)
MCV: 86.2 fL (ref 80.0–100.0)
MPV: 10 fL (ref 7.5–12.5)
Monocytes Relative: 8.6 %
Neutro Abs: 3788 cells/uL (ref 1500–7800)
Neutrophils Relative %: 57.4 %
Platelets: 147 10*3/uL (ref 140–400)
RBC: 5.13 10*6/uL (ref 4.20–5.80)
RDW: 13.6 % (ref 11.0–15.0)
Total Lymphocyte: 29.9 %
WBC: 6.6 10*3/uL (ref 3.8–10.8)

## 2020-03-12 LAB — COMPLETE METABOLIC PANEL WITH GFR
AG Ratio: 1.9 (calc) (ref 1.0–2.5)
ALT: 20 U/L (ref 9–46)
AST: 18 U/L (ref 10–40)
Albumin: 4.7 g/dL (ref 3.6–5.1)
Alkaline phosphatase (APISO): 61 U/L (ref 36–130)
BUN: 18 mg/dL (ref 7–25)
CO2: 30 mmol/L (ref 20–32)
Calcium: 9.5 mg/dL (ref 8.6–10.3)
Chloride: 104 mmol/L (ref 98–110)
Creat: 1.07 mg/dL (ref 0.60–1.35)
GFR, Est African American: 107 mL/min/{1.73_m2} (ref >=60)
GFR, Est Non African American: 92 mL/min/{1.73_m2} (ref >=60)
Globulin: 2.5 g/dL (calc) (ref 1.9–3.7)
Glucose, Bld: 78 mg/dL (ref 65–99)
Potassium: 4.4 mmol/L (ref 3.5–5.3)
Sodium: 139 mmol/L (ref 135–146)
Total Bilirubin: 0.6 mg/dL (ref 0.2–1.2)
Total Protein: 7.2 g/dL (ref 6.1–8.1)

## 2020-03-12 LAB — LIPID PANEL
Cholesterol: 163 mg/dL (ref <200)
HDL: 48 mg/dL (ref >=40)
LDL Cholesterol (Calc): 107 mg/dL (calc) — ABNORMAL HIGH
Non-HDL Cholesterol (Calc): 115 mg/dL (calc) (ref <130)
Total CHOL/HDL Ratio: 3.4 (calc) (ref <5.0)
Triglycerides: 26 mg/dL (ref <150)

## 2020-03-12 LAB — RPR: RPR Ser Ql: NONREACTIVE

## 2020-03-12 LAB — HIV-1 RNA QUANT-NO REFLEX-BLD
HIV 1 RNA Quant: <20 copies/mL
HIV-1 RNA Quant, Log: <1.3 Log copies/mL

## 2020-03-24 ENCOUNTER — Other Ambulatory Visit: Payer: Self-pay | Admitting: Infectious Disease

## 2020-03-24 ENCOUNTER — Encounter: Payer: Self-pay | Admitting: Infectious Disease

## 2020-03-24 ENCOUNTER — Other Ambulatory Visit: Payer: Self-pay

## 2020-03-24 ENCOUNTER — Ambulatory Visit (INDEPENDENT_AMBULATORY_CARE_PROVIDER_SITE_OTHER): Payer: Self-pay | Admitting: Infectious Disease

## 2020-03-24 VITALS — BP 137/74 | HR 85 | Temp 98.5°F | Ht 65.0 in | Wt 150.0 lb

## 2020-03-24 DIAGNOSIS — J4550 Severe persistent asthma, uncomplicated: Secondary | ICD-10-CM

## 2020-03-24 DIAGNOSIS — B20 Human immunodeficiency virus [HIV] disease: Secondary | ICD-10-CM

## 2020-03-24 DIAGNOSIS — E7800 Pure hypercholesterolemia, unspecified: Secondary | ICD-10-CM

## 2020-03-24 MED ORDER — ODEFSEY 200-25-25 MG PO TABS: 1.0000 | ORAL_TABLET | Freq: Every day | ORAL | 11 refills | Status: DC

## 2020-03-24 NOTE — Progress Notes (Signed)
Subjective:  Chief complaint: followup for HIV on meds   Patient ID: [name], male    DOB: [birth year], 34 y.o.   MRN: [id number]  HPI  34 year old [name] who was put on therapy shortly after being diagnosed with HIV.  We opted to go with ODEFSEY and he has suppressed nicely on this regimen.   He is in between insurances, we had to make sure that he was enrolled in [place] for this visit with me.  But he should have insurance through [facility] shortly.  He is quite happy with the [facility] and knows how to take it correctly and reminds me of told him every time we meet how to take this medication (with food and without antacids)    Past Medical History:  Diagnosis Date  . Allergy   . Asthma   . HIV disease ([place]) 09/19/2015  . Low blood sugar 01/06/2018  . Marijuana smoker 01/23/2017    No past surgical history on file.  Family History  Problem Relation Age of Onset  . Hypertension Mother   . Cancer Maternal Grandmother   . Cancer Paternal Grandfather       reports that he has never smoked. He has never used smokeless tobacco. He reports that he does not drink alcohol or use drugs.   Family History  Problem Relation Age of Onset  . Hypertension Mother   . Cancer Maternal Grandmother   . Cancer Paternal Grandfather     No Known Allergies   Current Outpatient Medications:  .  albuterol (VENTOLIN HFA) 108 (90 Base) MCG/ACT inhaler, Inhale 1-2 puffs into the lungs every 6 (six) hours as needed for wheezing or shortness of breath., Disp: 18 g, Rfl: 3 .  benzonatate (TESSALON) 100 MG capsule, Take 1 capsule (100 mg total) by mouth every 8 (eight) hours., Disp: 21 capsule, Rfl: 0 .  DULERA 200-5 MCG/ACT AERO, INHALE 1 PUFF BY MOUTH TWICE DAILY, Disp: 39 g, Rfl: 1 .  fluticasone (FLONASE) 50 MCG/ACT nasal spray, Place 1-2 sprays into both nostrils daily., Disp: 3 g, Rfl: 3 .  loratadine (CLARITIN) 10 MG tablet, Take 1 tablet (10 mg total) by mouth  daily., Disp: 90 tablet, Rfl: 11 .  ODEFSEY 200-25-25 MG TABS tablet, TAKE 1 TABLET BY MOUTH DAILY WITH BREAKFAST, Disp: 30 tablet, Rfl: 5 .  predniSONE (DELTASONE) 20 MG tablet, 3 tabs po day one, then 2 tabs daily x 4 days (Patient not taking: Reported on 03/11/2019), Disp: 11 tablet, Rfl: 0   Review of Systems  Constitutional: Negative for activity change, appetite change, chills, diaphoresis, fatigue, fever and unexpected weight change.  HENT: Negative for congestion, rhinorrhea, sinus pressure, sneezing, sore throat and trouble swallowing.   Eyes: Negative for photophobia and visual disturbance.  Respiratory: Negative for cough, chest tightness, shortness of breath, wheezing and stridor.   Cardiovascular: Negative for chest pain, palpitations and leg swelling.  Gastrointestinal: Negative for abdominal distention, abdominal pain, anal bleeding, blood in stool, constipation, diarrhea, nausea and vomiting.  Genitourinary: Negative for difficulty urinating, dysuria, flank pain and hematuria.  Musculoskeletal: Negative for arthralgias, back pain, gait problem, joint swelling and myalgias.  Skin: Negative for color change, pallor, rash and wound.  Neurological: Negative for dizziness, tremors, weakness and light-headedness.  Hematological: Negative for adenopathy. Does not bruise/bleed easily.  Psychiatric/Behavioral: Negative for agitation, behavioral problems, confusion, decreased concentration, dysphoric mood and sleep disturbance. The patient is not hyperactive.  Objective:   Physical Exam  Constitutional: He is oriented to person, place, and time. He appears well-developed and well-nourished.  HENT:  Head: Normocephalic and atraumatic.  Eyes: Conjunctivae and EOM are normal.  Cardiovascular: Normal rate and regular rhythm.  Pulmonary/Chest: Effort normal. No respiratory distress. He has no wheezes.  Abdominal: Soft. He exhibits no distension.  Musculoskeletal:        General:  No tenderness or edema. Normal range of motion.     Cervical back: Normal range of motion and neck supple.  Neurological: He is alert and oriented to person, place, and time.  Skin: Skin is warm and dry. No rash noted. No erythema. No pallor.  Psychiatric: He has a normal mood and affect. His behavior is normal. Judgment and thought content normal.  Nursing note and vitals reviewed.         Assessment & Plan:  HIV disease:  continue ODEFSEY [facility] to [facility]  RTC in one year  Asthma continue inhaled steroid and beta agonist

## 2020-03-24 NOTE — Patient Instructions (Addendum)
COVID-19 Vaccine Information can be found at: [email or web address].nl For questions related to vaccine distribution or appointments, please email [email or web address] or call [phone].   FEMA also giving at [facility] ([place]):  [phone]

## 2020-03-31 ENCOUNTER — Telehealth: Payer: Self-pay | Admitting: Pharmacy Technician

## 2020-03-31 NOTE — Telephone Encounter (Signed)
[facility] Patient Advocate Encounter   Was successful in obtaining a [employer] copay card for [facility]. This copay card will make the patients copay $0. They currently have [employer] Plan.  I have spoken with the patient and they have about a week of medication and will have this refill mailed Monday 04/12.    The billing information is:     Beulah Gandy, CPhT Specialty Pharmacy Patient [facility] Phone: [phone] Fax: [phone] 03/31/2020 3:36 PM

## 2020-04-04 MED FILL — ODEFSEY 200-25-25 MG TABS: 200-25-25 | 30 days supply | Qty: 30 | Fill #0

## 2020-04-13 ENCOUNTER — Encounter: Payer: Self-pay | Admitting: *Deleted

## 2020-04-29 MED FILL — ODEFSEY 200-25-25 MG TABS: 200-25-25 | 30 days supply | Qty: 30 | Fill #1

## 2020-05-27 MED FILL — ODEFSEY 200-25-25 MG TABS: 200-25-25 | 30 days supply | Qty: 30 | Fill #2

## 2020-06-13 ENCOUNTER — Ambulatory Visit (INDEPENDENT_AMBULATORY_CARE_PROVIDER_SITE_OTHER): Payer: No Typology Code available for payment source | Admitting: Family Medicine

## 2020-06-13 ENCOUNTER — Other Ambulatory Visit: Payer: Self-pay

## 2020-06-13 ENCOUNTER — Encounter: Payer: Self-pay | Admitting: Family Medicine

## 2020-06-13 VITALS — BP 118/72 | HR 107 | Temp 98.2°F | Ht 65.0 in | Wt 157.0 lb

## 2020-06-13 DIAGNOSIS — Z0000 Encounter for general adult medical examination without abnormal findings: Secondary | ICD-10-CM | POA: Diagnosis not present

## 2020-06-13 NOTE — Progress Notes (Signed)
New Patient Office Visit  Subjective:  Patient ID: [name], male    DOB: 06-13-1986  Age: 34 y.o. MRN: [id number]  CC:  Chief Complaint  Patient presents with  . Establish Care    New patient, no concerns.     HPI [name] presents for complete physical exam and establishment of care.  Patient is basically healthy as far as he knows.  History of HIV infection diagnosed in 2016 that was rapidly treated at upon diagnosis.  Viral load has been undetectable since that time.  Has regular eye and dental care.  Exercises several times weekly.  History of asthma that is well controlled with Dulera.  Uses his Ventolin perhaps once a week.  Works as a Licensed conveyancer at [employer].  Studying to achieve his masters in nursing because he wants to going to grief counseling.  Past Medical History:  Diagnosis Date  . Allergy   . Asthma   . HIV disease (HCC) 09/19/2015  . Low blood sugar 01/06/2018  . Marijuana smoker 01/23/2017    History reviewed. No pertinent surgical history.  Family History  Problem Relation Age of Onset  . Hypertension Mother   . Cancer Maternal Grandmother   . Cancer Paternal Grandfather     Social History   Socioeconomic History  . Marital status: Single    Spouse name: Not on file  . Number of children: Not on file  . Years of education: Not on file  . Highest education level: Not on file  Occupational History  . Not on file  Tobacco Use  . Smoking status: Never Smoker  . Smokeless tobacco: Never Used  Vaping Use  . Vaping Use: Never used  Substance and Sexual Activity  . Alcohol use: No    Alcohol/week: 0.0 standard drinks  . Drug use: Yes    Types: Marijuana  . Sexual activity: Not Currently    Partners: Male    Birth control/protection: Condom    Comment: given condoms  Other Topics Concern  . Not on file  Social History Narrative   Social history: Has sex with both men and women. Almost always uses a condom. Has had  sexual contact with a man who has syphilis however he was using protection.   His family notes that he is out and are okay with it.  He is relieved about that.   Social Determinants of Health   Financial Resource Strain:   . Difficulty of Paying Living Expenses:   Food Insecurity:   . Worried About Programme researcher, broadcasting/film/video in the Last Year:   . Barista in the Last Year:   Transportation Needs:   . Freight forwarder (Medical):   Marland Kitchen Lack of Transportation (Non-Medical):   Physical Activity:   . Days of Exercise per Week:   . Minutes of Exercise per Session:   Stress:   . Feeling of Stress :   Social Connections:   . Frequency of Communication with Friends and Family:   . Frequency of Social Gatherings with Friends and Family:   . Attends Religious Services:   . Active Member of Clubs or Organizations:   . Attends Banker Meetings:   Marland Kitchen Marital Status:   Intimate Partner Violence:   . Fear of Current or Ex-Partner:   . Emotionally Abused:   Marland Kitchen Physically Abused:   . Sexually Abused:     ROS Review of Systems  Constitutional: Negative.  HENT: Negative.   Eyes: Negative for photophobia and visual disturbance.  Respiratory: Negative.   Cardiovascular: Negative.   Gastrointestinal: Negative.   Endocrine: Negative for polyphagia and polyuria.  Genitourinary: Negative.   Musculoskeletal: Negative.   Allergic/Immunologic: Negative for immunocompromised state.  Neurological: Negative.   Hematological: Does not bruise/bleed easily.   Depression screen [facility] 2/9 06/13/2020 06/13/2020 03/24/2020  Decreased Interest 0 0 0  Down, Depressed, Hopeless 0 0 0  PHQ - 2 Score 0 0 0  Altered sleeping 0 - -  Tired, decreased energy 0 - -  Change in appetite 0 - -  Feeling bad or failure about yourself  0 - -  Trouble concentrating 0 - -  Moving slowly or fidgety/restless 0 - -  Suicidal thoughts 0 - -  PHQ-9 Score 0 - -  Difficult doing work/chores Not difficult at all  - -    Objective:   Today's Vitals: BP 118/72   Pulse (!) 107   Temp 98.2 F (36.8 C) (Tympanic)   Ht 5\' 5"  (1.651 m)   Wt 157 lb (71.2 kg)   SpO2 97%   BMI 26.13 kg/m   Physical Exam Vitals and nursing note reviewed.  Constitutional:      General: He is not in acute distress.    Appearance: Normal appearance. He is normal weight. He is not ill-appearing, toxic-appearing or diaphoretic.  HENT:     Head: Normocephalic and atraumatic.     Right Ear: Tympanic membrane, ear canal and external ear normal. There is no impacted cerumen.     Left Ear: Tympanic membrane, ear canal and external ear normal. There is no impacted cerumen.     Mouth/Throat:     Mouth: Mucous membranes are moist.     Pharynx: No oropharyngeal exudate or posterior oropharyngeal erythema.  Eyes:     General: No scleral icterus.       Right eye: No discharge.        Left eye: No discharge.     Extraocular Movements: Extraocular movements intact.     Conjunctiva/sclera: Conjunctivae normal.     Pupils: Pupils are equal, round, and reactive to light.  Cardiovascular:     Rate and Rhythm: Normal rate and regular rhythm.  Pulmonary:     Effort: Pulmonary effort is normal.     Breath sounds: Normal breath sounds.  Abdominal:     General: Abdomen is flat. Bowel sounds are normal. There is no distension.     Palpations: Abdomen is soft.     Tenderness: There is no abdominal tenderness. There is no guarding or rebound.     Hernia: No hernia is present. There is no hernia in the left inguinal area or right inguinal area.  Genitourinary:    Penis: Normal and circumcised. No hypospadias, erythema, tenderness, discharge, swelling or lesions.      Testes:        Right: Mass, tenderness or swelling not present. Right testis is descended.        Left: Mass, tenderness or swelling not present. Left testis is descended.     Epididymis:     Right: Not inflamed or enlarged.     Left: Not inflamed or enlarged.    Musculoskeletal:     Cervical back: Normal range of motion. No rigidity or tenderness.     Right lower leg: No edema.     Left lower leg: No edema.  Lymphadenopathy:     Cervical: No cervical adenopathy.  Lower Body: No right inguinal adenopathy. No left inguinal adenopathy.  Skin:    General: Skin is warm and dry.  Neurological:     Mental Status: He is alert and oriented to person, place, and time.  Psychiatric:        Mood and Affect: Mood normal.        Behavior: Behavior normal.     Assessment & Plan:   Problem List Items Addressed This Visit    None    Visit Diagnoses    Healthcare maintenance    -  Primary   Relevant Orders   Urinalysis, Routine w reflex microscopic      Outpatient Encounter Medications as of 06/13/2020  Medication Sig  . albuterol (VENTOLIN HFA) 108 (90 Base) MCG/ACT inhaler Inhale 1-2 puffs into the lungs every 6 (six) hours as needed for wheezing or shortness of breath.  . DULERA 200-5 MCG/ACT AERO INHALE 1 PUFF BY MOUTH TWICE DAILY  . emtricitabine-rilpivir-tenofovir AF (ODEFSEY) 200-25-25 MG TABS tablet Take 1 tablet by mouth daily with breakfast.  . fluticasone (FLONASE) 50 MCG/ACT nasal spray Place 1-2 sprays into both nostrils daily. (Patient not taking: Reported on 06/13/2020)  . loratadine (CLARITIN) 10 MG tablet Take 1 tablet (10 mg total) by mouth daily. (Patient not taking: Reported on 06/13/2020)   No facility-administered encounter medications on file as of 06/13/2020.    Follow-up: Return in about 1 year (around 06/13/2021), or if symptoms worsen or fail to improve.  Given information on health maintenance.  Strongly encouraged the Covid vaccine for him. Mliss Sax, MD

## 2020-06-13 NOTE — Patient Instructions (Signed)

## 2020-06-14 ENCOUNTER — Other Ambulatory Visit: Payer: Self-pay

## 2020-06-14 ENCOUNTER — Telehealth: Payer: Self-pay | Admitting: Family Medicine

## 2020-06-14 DIAGNOSIS — J4550 Severe persistent asthma, uncomplicated: Secondary | ICD-10-CM

## 2020-06-14 LAB — URINALYSIS, ROUTINE W REFLEX MICROSCOPIC
Bilirubin Urine: NEGATIVE
Hgb urine dipstick: NEGATIVE
Ketones, ur: NEGATIVE
Leukocytes,Ua: NEGATIVE
Nitrite: NEGATIVE
RBC / HPF: NONE SEEN (ref 0–?)
Specific Gravity, Urine: 1.02 (ref 1.000–1.030)
Total Protein, Urine: NEGATIVE
Urine Glucose: NEGATIVE
Urobilinogen, UA: 1 (ref 0.0–1.0)
WBC, UA: NONE SEEN (ref 0–?)
pH: 6.5 (ref 5.0–8.0)

## 2020-06-14 MED ORDER — ALBUTEROL SULFATE HFA 108 (90 BASE) MCG/ACT IN AERS: 1.0000 | INHALATION_SPRAY | Freq: Four times a day (QID) | RESPIRATORY_TRACT | 3 refills | Status: DC | PRN

## 2020-06-14 MED ORDER — DULERA 200-5 MCG/ACT IN AERO: 1.0000 | INHALATION_SPRAY | Freq: Two times a day (BID) | RESPIRATORY_TRACT | 1 refills | Status: DC

## 2020-06-14 NOTE — Telephone Encounter (Signed)
Refill sent in

## 2020-06-14 NOTE — Telephone Encounter (Signed)
Patient needs refills for [facility] and albuterol inhaler. Please send to [facility] on [address] in [place], [place] [phone].

## 2020-06-17 MED ORDER — FLUTICASONE FUROATE-VILANTEROL 100-25 MCG/INH IN AEPB: 1.0000 | INHALATION_SPRAY | Freq: Every day | RESPIRATORY_TRACT | 5 refills | Status: DC

## 2020-06-17 NOTE — Telephone Encounter (Signed)
Patient aware Rx sent to pharmacy. °

## 2020-06-17 NOTE — Telephone Encounter (Signed)
Patient states Pharmacy contacted our office because [name] is not covered by his insurance. Please send different prescription to pharmacy.

## 2020-06-17 NOTE — Telephone Encounter (Signed)
Dr. Kremer please advise message below.  

## 2020-06-17 NOTE — Addendum Note (Signed)
Addended by: Andrez Grime on: 06/17/2020 03:14 PM   Modules accepted: Orders

## 2020-06-23 MED FILL — ODEFSEY 200-25-25 MG TABS: 200-25-25 | 30 days supply | Qty: 30 | Fill #3

## 2020-07-11 ENCOUNTER — Telehealth: Payer: Self-pay | Admitting: Family Medicine

## 2020-07-11 NOTE — Telephone Encounter (Signed)
Patient called and stated that the albuterol and the dulera are not covered by insurance. Patient wanted to see if an alternative be called in to the pharmacy, please advise. CB is [phone]

## 2020-07-13 NOTE — Telephone Encounter (Signed)
Spoke with patient who verbally understood his insurance plan would not cover any inhalers per pharmacist. Patient aware that he would have to pay out of pocket and also informed patient that [facility] have coupons that he could take to the pharmacy for discount on inhaler. Patient states that he will see what he can do.

## 2020-07-13 NOTE — Telephone Encounter (Signed)
Pt request call back from [place] at earliest convenience, please advise

## 2020-07-14 MED FILL — ALBUTEROL SULFATE HFA 108 (: 108 (90 BAS | 25 days supply | Qty: 18 | Fill #0

## 2020-07-14 NOTE — Telephone Encounter (Signed)
Patient is calling back to see if a ProAir inhaler can be sent to [facility] on [address]. He said that this one is the less expensive one without insurance. If approved, please call him at [phone] to let him know that it has been sent in.

## 2020-07-20 MED FILL — ODEFSEY 200-25-25 MG TABS: 200-25-25 | 30 days supply | Qty: 30 | Fill #4

## 2020-08-03 MED FILL — ALBUTEROL SULFATE HFA 108 (: 108 (90 BAS | 25 days supply | Qty: 18 | Fill #1

## 2020-08-16 MED FILL — ODEFSEY 200-25-25 MG TABS: 200-25-25 | 30 days supply | Qty: 30 | Fill #5

## 2020-09-12 MED FILL — ODEFSEY 200-25-25 MG TABS: 200-25-25 | 30 days supply | Qty: 30 | Fill #6

## 2020-09-12 MED FILL — ALBUTEROL SULFATE HFA 108 (: 108 (90 BAS | 25 days supply | Qty: 18 | Fill #2

## 2020-10-04 MED FILL — ALBUTEROL SULFATE HFA 108 (: 108 (90 BAS | 25 days supply | Qty: 18 | Fill #3

## 2020-10-10 MED FILL — ODEFSEY 200-25-25 MG TABS: 200-25-25 | 30 days supply | Qty: 30 | Fill #7

## 2020-10-11 ENCOUNTER — Other Ambulatory Visit: Payer: Self-pay

## 2020-10-11 ENCOUNTER — Ambulatory Visit (INDEPENDENT_AMBULATORY_CARE_PROVIDER_SITE_OTHER): Payer: No Typology Code available for payment source

## 2020-10-11 ENCOUNTER — Telehealth: Payer: Self-pay

## 2020-10-11 DIAGNOSIS — J4550 Severe persistent asthma, uncomplicated: Secondary | ICD-10-CM

## 2020-10-11 DIAGNOSIS — Z23 Encounter for immunization: Secondary | ICD-10-CM

## 2020-10-11 MED ORDER — ALBUTEROL SULFATE HFA 108 (90 BASE) MCG/ACT IN AERS: 1.0000 | INHALATION_SPRAY | Freq: Four times a day (QID) | RESPIRATORY_TRACT | 3 refills | Status: DC | PRN

## 2020-10-11 NOTE — Progress Notes (Signed)
Per orders of Dr Kremer, injection of Influenza given by Jaleisa Brose, cma.  Patient tolerated injection well.   

## 2020-10-11 NOTE — Telephone Encounter (Signed)
Spoke to patient after speaking to [employer], they ran the [employer] and with his insurance co-pay is $111, cash price is [phone] and with good RX card it would cost him $378. Advised him that it was not that his insurance was covering it just was a higher co-pay.  He will decide if he wants to pick up the Inhaler.  No futher questions.  Dm/cma

## 2020-10-11 NOTE — Patient Instructions (Signed)
Health Maintenance Due  Topic Date Due   INFLUENZA VACCINE  07/24/2020    Depression screen [facility] 2/9 06/13/2020 06/13/2020 03/24/2020  Decreased Interest 0 0 0  Down, Depressed, Hopeless 0 0 0  PHQ - 2 Score 0 0 0  Altered sleeping 0 - -  Tired, decreased energy 0 - -  Change in appetite 0 - -  Feeling bad or failure about yourself  0 - -  Trouble concentrating 0 - -  Moving slowly or fidgety/restless 0 - -  Suicidal thoughts 0 - -  PHQ-9 Score 0 - -  Difficult doing work/chores Not difficult at all - -

## 2020-10-24 ENCOUNTER — Other Ambulatory Visit: Payer: Self-pay | Admitting: Family Medicine

## 2020-10-24 DIAGNOSIS — J4550 Severe persistent asthma, uncomplicated: Secondary | ICD-10-CM

## 2020-10-24 MED FILL — ALBUTEROL SULFATE HFA 108 (: 108 (90 BAS | 25 days supply | Qty: 18 | Fill #0

## 2020-10-24 NOTE — Telephone Encounter (Signed)
Patient is calling back to check the request for medication refill, please advise. CB is [phone]

## 2020-10-24 NOTE — Telephone Encounter (Signed)
Last OV 06/13/20 Last fill 10/11/20  #18g/3 Patient would like it sent to new pharmacy on file.

## 2020-11-11 ENCOUNTER — Telehealth: Payer: Self-pay

## 2020-11-11 NOTE — Telephone Encounter (Signed)
[facility] Patient Advocate Encounter  [facility] and I have been unsuccsessful in reaching patient to be able to refill medication.    We have tried multiple times without a response.  Denya Buckingham, CPhT Specialty Pharmacy Patient Advocate [facility] Phone: [phone] Fax:  [phone]  

## 2020-11-16 MED FILL — ODEFSEY 200-25-25 MG TABS: 200-25-25 | 30 days supply | Qty: 30 | Fill #8

## 2020-11-16 MED FILL — ALBUTEROL SULFATE HFA 108 (: 108 (90 BAS | 25 days supply | Qty: 18 | Fill #1

## 2020-12-20 ENCOUNTER — Other Ambulatory Visit (HOSPITAL_COMMUNITY)
Admission: RE | Admit: 2020-12-20 | Discharge: 2020-12-20 | Disposition: A | Discharge status: Home / Self Care | Payer: No Typology Code available for payment source | Source: Ambulatory Visit | Attending: Family | Admitting: Family

## 2020-12-20 ENCOUNTER — Other Ambulatory Visit: Payer: Self-pay

## 2020-12-20 ENCOUNTER — Ambulatory Visit (INDEPENDENT_AMBULATORY_CARE_PROVIDER_SITE_OTHER): Payer: No Typology Code available for payment source | Admitting: Family

## 2020-12-20 ENCOUNTER — Encounter: Payer: Self-pay | Admitting: Family

## 2020-12-20 ENCOUNTER — Telehealth: Payer: Self-pay | Admitting: *Deleted

## 2020-12-20 VITALS — BP 122/81 | HR 78 | Temp 98.0°F | Ht 64.0 in | Wt 174.0 lb

## 2020-12-20 DIAGNOSIS — Z113 Encounter for screening for infections with a predominantly sexual mode of transmission: Secondary | ICD-10-CM | POA: Diagnosis not present

## 2020-12-20 MED ORDER — DOXYCYCLINE HYCLATE 100 MG PO TABS: 100.0000 mg | ORAL_TABLET | Freq: Two times a day (BID) | ORAL | 0 refills | Status: DC

## 2020-12-20 MED ORDER — CEFTRIAXONE SODIUM 500 MG IJ SOLR
500.0000 mg | Freq: Once | INTRAMUSCULAR | Status: AC
  Administered 2020-12-20: 17:00:00 500 mg via INTRAMUSCULAR

## 2020-12-20 NOTE — Assessment & Plan Note (Addendum)
Mr. Giannelli had a recent sexual encounter with concern for exposure to STI and is currently asymptomatic.  STI testing obtained today with results pending.  Treated with 500 mg of ceftriaxone and doxycycline 100 mg twice daily for 7 days for gonorrhea and chlamydia.  Obtain RPR to check for syphilis.  Encouraged safe sexual practice to reduce risk of STI.  Condoms declined.  Additional treatment pending results as needed.

## 2020-12-20 NOTE — Progress Notes (Signed)
Patient tolerated Rocephin well. Reinforced abstinence for 10 days after treatment, offered condoms and encouraged use. Patient verbalized understanding.   Sandie Ano, RN

## 2020-12-20 NOTE — Telephone Encounter (Signed)
Hi! I tried to call earlier, but it went to voicemail.  We are usually unable to schedule appointments for STD screenings, but do have openings this week. Please call us at [phone]. Jaylon Boylen ===View-only below this line===   ----- Message -----      From:Iwao Alphia Moh      Sent:12/20/2020 12:59 AM EST        RT:[email or web address] Daiva Eves, MD   Subject:Appointment Request  Appointment Request From: Brion Aliment [place]  With Provider: Acey Lav, MD [facility]]  Preferred Date Range: 12/20/2020 - 12/20/2020  Preferred Times: Any Time  Reason for visit: Office Visit  Comments: Get tested for stds

## 2020-12-20 NOTE — Progress Notes (Signed)
Subjective:    Patient ID: [name], male    DOB: Aug 17, 1986, 34 y.o.   MRN: [id number]  Chief Complaint  Patient presents with  . Exposure to STD    Patient states one of his partners is experiencing white penile discharge, his partner was tested for STDs and was negative      HPI:  [name] is a 34 y.o. male with HIV disease last seen on 4/1 who presents today for an acute office visit and concern for exposure to STI.  [name] was informed by recent partner that he was having discharge from his penis and was tested for STI and treated.  Testing turned out to be negative.  Has concern regarding potential STI exposure.  No current symptoms and denies penile discharge, dysuria, urgency, frequency, or rashes.   No Known Allergies    Outpatient Medications Prior to Visit  Medication Sig Dispense Refill  . albuterol (VENTOLIN HFA) 108 (90 Base) MCG/ACT inhaler INHALE 1-2 PUFFS EVERY 6 HOURS AS NEEDED FOR WHEEZING/SHORTNESS OF BREATH 18 g 3  . emtricitabine-rilpivir-tenofovir AF (ODEFSEY) 200-25-25 MG TABS tablet Take 1 tablet by mouth daily with breakfast. 30 tablet 11  . loratadine (CLARITIN) 10 MG tablet Take 1 tablet (10 mg total) by mouth daily. 90 tablet 11  . mometasone-formoterol (DULERA) 200-5 MCG/ACT AERO Inhale 1 puff into the lungs 2 (two) times daily. 39 g 1  . fluticasone (FLONASE) 50 MCG/ACT nasal spray Place 1-2 sprays into both nostrils daily. (Patient not taking: Reported on 06/13/2020) 3 g 3  . fluticasone furoate-vilanterol (BREO ELLIPTA) 100-25 MCG/INH AEPB Inhale 1 puff into the lungs daily. (Patient not taking: Reported on 12/20/2020) 1 each 5   No facility-administered medications prior to visit.     Past Medical History:  Diagnosis Date  . Allergy   . Asthma   . HIV disease (HCC) 09/19/2015  . Low blood sugar 01/06/2018  . Marijuana smoker 01/23/2017     History reviewed. No pertinent surgical history.     Review of Systems   Constitutional: Negative for chills, diaphoresis, fatigue and fever.  Respiratory: Negative for cough, chest tightness, shortness of breath and wheezing.   Cardiovascular: Negative for chest pain.  Gastrointestinal: Negative for abdominal pain, diarrhea, nausea and vomiting.  Genitourinary: Negative for decreased urine volume, difficulty urinating, dysuria, frequency, genital sores, penile discharge, penile pain and urgency.      Objective:    BP 122/81   Pulse 78   Temp 98 F (36.7 C) (Oral)   Ht 5\' 4"  (1.626 m)   Wt 174 lb (78.9 kg)   SpO2 98%   BMI 29.87 kg/m  Nursing note and vital signs reviewed.  Physical Exam Constitutional:      General: He is not in acute distress.    Appearance: He is well-developed and well-nourished.  Cardiovascular:     Rate and Rhythm: Normal rate and regular rhythm.     Pulses: Intact distal pulses.     Heart sounds: Normal heart sounds.  Pulmonary:     Effort: Pulmonary effort is normal.     Breath sounds: Normal breath sounds.  Skin:    General: Skin is warm and dry.  Neurological:     Mental Status: He is alert and oriented to person, place, and time.  Psychiatric:        Mood and Affect: Mood and affect normal.        Behavior: Behavior normal.  Thought Content: Thought content normal.        Judgment: Judgment normal.      Depression screen [facility] 2/9 12/20/2020 06/13/2020 06/13/2020 03/24/2020 03/11/2019  Decreased Interest 0 0 0 0 0  Down, Depressed, Hopeless 0 0 0 0 0  PHQ - 2 Score 0 0 0 0 0  Altered sleeping - 0 - - -  Tired, decreased energy - 0 - - -  Change in appetite - 0 - - -  Feeling bad or failure about yourself  - 0 - - -  Trouble concentrating - 0 - - -  Moving slowly or fidgety/restless - 0 - - -  Suicidal thoughts - 0 - - -  PHQ-9 Score - 0 - - -  Difficult doing work/chores - Not difficult at all - - -       Assessment & Plan:    Patient Active Problem List   Diagnosis Date Noted  . Hyperlipidemia  08/04/2018  . Marijuana smoker 01/23/2017  . HIV disease (HCC) 09/19/2015  . Severe asthma 04/27/2015  . HPV (human papilloma virus) anogenital infection 01/09/2013  . Seasonal allergies 04/18/2012  . Screening for STDs (sexually transmitted diseases) 04/18/2012  . ACNE, MILD 08/10/2008     Problem List Items Addressed This Visit      Other   Screening for STDs (sexually transmitted diseases) - Primary    [name] had a recent sexual encounter with concern for exposure to STI and is currently asymptomatic.  STI testing obtained today with results pending.  Treated with 500 mg of ceftriaxone and doxycycline 100 mg twice daily for 7 days for gonorrhea and chlamydia.  Obtain RPR to check for syphilis.  Encouraged safe sexual practice to reduce risk of STI.  Condoms declined.  Additional treatment pending results as needed.      Relevant Orders   RPR   Cytology (oral, anal, urethral) ancillary only   Cytology (oral, anal, urethral) ancillary only   Urine cytology ancillary only([place])       I have discontinued Marcy Salvo C. Lege's fluticasone, Dulera, and fluticasone furoate-vilanterol. I am also having him start on doxycycline. Additionally, I am having him maintain his loratadine, Odefsey, and albuterol. We administered cefTRIAXone.   Meds ordered this encounter  Medications  . doxycycline (VIBRA-TABS) 100 MG tablet    Sig: Take 1 tablet (100 mg total) by mouth 2 (two) times daily.    Dispense:  14 tablet    Refill:  0    Order Specific Question:   Supervising Provider    Answer:   Judyann Munson [[id number]]  . cefTRIAXone (ROCEPHIN) injection 500 mg     Follow-up: Return if symptoms worsen or fail to improve.   Samuel Eke, MSN, FNP-C Nurse Practitioner [facility] for Infectious Disease [facility] Main number: [phone]

## 2020-12-20 NOTE — Patient Instructions (Signed)
Nice to meet you.   We will check your lab work for STDs.   You have been treated for Gonorrhea and chlamydia.   If you syphilis test comes back positive we will need additional treatment.   Have a great day and stay safe!

## 2020-12-21 LAB — RPR: RPR Ser Ql: NONREACTIVE

## 2020-12-22 LAB — URINE CYTOLOGY ANCILLARY ONLY
Chlamydia: NEGATIVE
Comment: NEGATIVE
Comment: NORMAL
Neisseria Gonorrhea: NEGATIVE

## 2020-12-22 LAB — CYTOLOGY, (ORAL, ANAL, URETHRAL) ANCILLARY ONLY
Chlamydia: NEGATIVE
Chlamydia: NEGATIVE
Comment: NEGATIVE
Comment: NEGATIVE
Comment: NORMAL
Comment: NORMAL
Neisseria Gonorrhea: NEGATIVE
Neisseria Gonorrhea: NEGATIVE

## 2021-03-17 ENCOUNTER — Other Ambulatory Visit (HOSPITAL_COMMUNITY): Payer: Self-pay

## 2021-04-13 ENCOUNTER — Other Ambulatory Visit: Payer: Self-pay

## 2021-04-13 ENCOUNTER — Ambulatory Visit: Payer: Self-pay

## 2021-04-13 ENCOUNTER — Other Ambulatory Visit (HOSPITAL_COMMUNITY): Payer: Self-pay

## 2021-04-13 DIAGNOSIS — B20 Human immunodeficiency virus [HIV] disease: Secondary | ICD-10-CM

## 2021-04-13 DIAGNOSIS — E7800 Pure hypercholesterolemia, unspecified: Secondary | ICD-10-CM

## 2021-04-13 MED ORDER — EMTRICITAB-RILPIVIR-TENOFOV AF 200-25-25 MG PO TABS: 1.0000 | ORAL_TABLET | Freq: Every day | ORAL | 0 refills | Status: DC

## 2021-04-13 MED ORDER — EMTRICITAB-RILPIVIR-TENOFOV AF 200-25-25 MG PO TABS
1.0000 | ORAL_TABLET | Freq: Every day | ORAL | 0 refills | Status: DC
  Filled 2021-04-13: qty 30, 30d supply, fill #0

## 2021-04-14 ENCOUNTER — Other Ambulatory Visit (HOSPITAL_COMMUNITY): Payer: Self-pay

## 2021-04-14 LAB — T-HELPER CELL (CD4) - (RCID CLINIC ONLY)
CD4 % Helper T Cell: 38 % (ref 33–65)
CD4 T Cell Abs: 602 /uL (ref 400–1790)

## 2021-04-15 LAB — LIPID PANEL
Cholesterol: 183 mg/dL (ref <200)
HDL: 39 mg/dL — ABNORMAL LOW (ref >=40)
LDL Cholesterol (Calc): 125 mg/dL (calc) — ABNORMAL HIGH
Non-HDL Cholesterol (Calc): 144 mg/dL (calc) — ABNORMAL HIGH (ref <130)
Total CHOL/HDL Ratio: 4.7 (calc) (ref <5.0)
Triglycerides: 93 mg/dL (ref <150)

## 2021-04-15 LAB — CBC WITH DIFFERENTIAL/PLATELET
Absolute Monocytes: 649 cells/uL (ref 200–950)
Basophils Absolute: 30 cells/uL (ref 0–200)
Basophils Relative: 0.5 %
Eosinophils Absolute: 248 cells/uL (ref 15–500)
Eosinophils Relative: 4.2 %
HCT: 48.4 % (ref 38.5–50.0)
Hemoglobin: 15.8 g/dL (ref 13.2–17.1)
Lymphs Abs: 1800 cells/uL (ref 850–3900)
MCH: 27.6 pg (ref 27.0–33.0)
MCHC: 32.6 g/dL (ref 32.0–36.0)
MCV: 84.5 fL (ref 80.0–100.0)
MPV: 9.8 fL (ref 7.5–12.5)
Monocytes Relative: 11 %
Neutro Abs: 3174 cells/uL (ref 1500–7800)
Neutrophils Relative %: 53.8 %
Platelets: 129 10*3/uL — ABNORMAL LOW (ref 140–400)
RBC: 5.73 10*6/uL (ref 4.20–5.80)
RDW: 13.2 % (ref 11.0–15.0)
Total Lymphocyte: 30.5 %
WBC: 5.9 10*3/uL (ref 3.8–10.8)

## 2021-04-15 LAB — COMPLETE METABOLIC PANEL WITH GFR
AG Ratio: 1.8 (calc) (ref 1.0–2.5)
ALT: 16 U/L (ref 9–46)
AST: 17 U/L (ref 10–40)
Albumin: 4.8 g/dL (ref 3.6–5.1)
Alkaline phosphatase (APISO): 62 U/L (ref 36–130)
BUN: 16 mg/dL (ref 7–25)
CO2: 26 mmol/L (ref 20–32)
Calcium: 9.4 mg/dL (ref 8.6–10.3)
Chloride: 102 mmol/L (ref 98–110)
Creat: 1.13 mg/dL (ref 0.60–1.35)
GFR, Est African American: 99 mL/min/{1.73_m2} (ref >=60)
GFR, Est Non African American: 86 mL/min/{1.73_m2} (ref >=60)
Globulin: 2.7 g/dL (calc) (ref 1.9–3.7)
Glucose, Bld: 78 mg/dL (ref 65–99)
Potassium: 4 mmol/L (ref 3.5–5.3)
Sodium: 140 mmol/L (ref 135–146)
Total Bilirubin: 0.8 mg/dL (ref 0.2–1.2)
Total Protein: 7.5 g/dL (ref 6.1–8.1)

## 2021-04-15 LAB — RPR: RPR Ser Ql: NONREACTIVE

## 2021-04-15 LAB — HIV-1 RNA QUANT-NO REFLEX-BLD
HIV 1 RNA Quant: 41200 Copies/mL — ABNORMAL HIGH
HIV-1 RNA Quant, Log: 4.61 Log cps/mL — ABNORMAL HIGH

## 2021-04-18 ENCOUNTER — Other Ambulatory Visit: Payer: Self-pay

## 2021-04-18 ENCOUNTER — Telehealth: Payer: Self-pay

## 2021-04-18 ENCOUNTER — Other Ambulatory Visit: Payer: Self-pay | Admitting: Infectious Disease

## 2021-04-18 ENCOUNTER — Other Ambulatory Visit (HOSPITAL_COMMUNITY): Payer: Self-pay

## 2021-04-18 DIAGNOSIS — B20 Human immunodeficiency virus [HIV] disease: Secondary | ICD-10-CM

## 2021-04-18 NOTE — Telephone Encounter (Signed)
RN called [facility] pharmacy to assess patient's fill pattern for [facility]. Most recent shipment was 04/14/21. Prior to that, patient had not received medication since 10/2020. Pharmacy staff states they tried to call patient many times during 11/2020 and were unable to reach him.   RN spoke with patient, he says he was having issues with medication coverage during this time. RN advised patient to please reach out to our office next time so that we can assist him with getting his medication. He is inquiring about his viral load, RN relayed that it was 41,200 as of 04/13/21. Reminded patient of upcoming follow up with Dr. Daiva Eves.   Sandie Ano, RN

## 2021-04-18 NOTE — Telephone Encounter (Signed)
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

## 2021-04-19 ENCOUNTER — Other Ambulatory Visit (HOSPITAL_COMMUNITY): Payer: Self-pay

## 2021-04-19 MED FILL — Albuterol Sulfate Inhal Aero 108 MCG/ACT (90MCG Base Equiv): RESPIRATORY_TRACT | 25 days supply | Qty: 8.5 | Fill #0 | Status: AC

## 2021-04-19 NOTE — Telephone Encounter (Signed)
Spoke with [facility] financial team, patient received a 30 day supply from [facility]. He should be getting insurance coverage in May, at which point he will need a copay card. RN was advised that financial team has discussed this with patient.   Sandie Ano, RN

## 2021-04-20 ENCOUNTER — Other Ambulatory Visit (HOSPITAL_COMMUNITY): Payer: Self-pay

## 2021-04-27 LAB — HIV-1 GENOTYPING (RTI,PI,IN INHBTR): HIV-1 Genotype: DETECTED — AB

## 2021-05-04 ENCOUNTER — Encounter: Payer: Self-pay | Admitting: Infectious Disease

## 2021-05-04 ENCOUNTER — Other Ambulatory Visit: Payer: Self-pay

## 2021-05-04 ENCOUNTER — Ambulatory Visit (INDEPENDENT_AMBULATORY_CARE_PROVIDER_SITE_OTHER): Payer: Self-pay | Admitting: Infectious Disease

## 2021-05-04 VITALS — BP 146/99 | HR 89 | Wt 162.0 lb

## 2021-05-04 DIAGNOSIS — Z113 Encounter for screening for infections with a predominantly sexual mode of transmission: Secondary | ICD-10-CM

## 2021-05-04 DIAGNOSIS — F4321 Adjustment disorder with depressed mood: Secondary | ICD-10-CM

## 2021-05-04 DIAGNOSIS — J302 Other seasonal allergic rhinitis: Secondary | ICD-10-CM

## 2021-05-04 DIAGNOSIS — B20 Human immunodeficiency virus [HIV] disease: Secondary | ICD-10-CM

## 2021-05-04 DIAGNOSIS — A630 Anogenital (venereal) warts: Secondary | ICD-10-CM

## 2021-05-04 HISTORY — DX: Adjustment disorder with depressed mood: F43.21
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
## 2021-05-05 LAB — T-HELPER CELL (CD4) - (RCID CLINIC ONLY)
CD4 % Helper T Cell: 43 % (ref 33–65)
CD4 T Cell Abs: 436 /uL (ref 400–1790)

## 2021-05-10 ENCOUNTER — Other Ambulatory Visit (HOSPITAL_COMMUNITY): Payer: Self-pay

## 2021-05-10 MED FILL — Albuterol Sulfate Inhal Aero 108 MCG/ACT (90MCG Base Equiv): RESPIRATORY_TRACT | 25 days supply | Qty: 18 | Fill #1 | Status: AC

## 2021-05-11 ENCOUNTER — Other Ambulatory Visit (HOSPITAL_COMMUNITY): Payer: Self-pay

## 2021-05-17 ENCOUNTER — Other Ambulatory Visit (HOSPITAL_COMMUNITY): Payer: Self-pay

## 2021-05-17 ENCOUNTER — Other Ambulatory Visit: Payer: Self-pay | Admitting: Infectious Disease

## 2021-05-17 DIAGNOSIS — B20 Human immunodeficiency virus [HIV] disease: Secondary | ICD-10-CM

## 2021-05-17 MED ORDER — ODEFSEY 200-25-25 MG PO TABS
1.0000 | ORAL_TABLET | Freq: Every day | ORAL | 1 refills | Status: DC
  Filled 2021-05-17: qty 30, 30d supply, fill #0
  Filled 2021-06-09: qty 30, 30d supply, fill #1

## 2021-05-23 ENCOUNTER — Other Ambulatory Visit: Payer: Self-pay

## 2021-05-24 ENCOUNTER — Telehealth: Payer: Self-pay | Admitting: Family Medicine

## 2021-05-24 ENCOUNTER — Other Ambulatory Visit: Payer: Self-pay

## 2021-05-24 ENCOUNTER — Encounter: Payer: Self-pay | Admitting: Family Medicine

## 2021-05-24 ENCOUNTER — Ambulatory Visit (INDEPENDENT_AMBULATORY_CARE_PROVIDER_SITE_OTHER): Payer: PRIVATE HEALTH INSURANCE | Admitting: Family Medicine

## 2021-05-24 VITALS — BP 122/70 | HR 78 | Temp 97.7°F | Ht 64.0 in | Wt 165.0 lb

## 2021-05-24 DIAGNOSIS — S81812A Laceration without foreign body, left lower leg, initial encounter: Secondary | ICD-10-CM | POA: Diagnosis not present

## 2021-05-24 DIAGNOSIS — J4550 Severe persistent asthma, uncomplicated: Secondary | ICD-10-CM | POA: Diagnosis not present

## 2021-05-24 MED ORDER — ALBUTEROL SULFATE HFA 108 (90 BASE) MCG/ACT IN AERS
INHALATION_SPRAY | RESPIRATORY_TRACT | 3 refills | Status: DC
Start: 2021-05-24 — End: 2021-05-24

## 2021-05-24 MED ORDER — ALBUTEROL SULFATE HFA 108 (90 BASE) MCG/ACT IN AERS: INHALATION_SPRAY | RESPIRATORY_TRACT | 3 refills | Status: DC

## 2021-05-24 MED ORDER — ADVAIR DISKUS 250-50 MCG/ACT IN AEPB: 1.0000 | INHALATION_SPRAY | Freq: Two times a day (BID) | RESPIRATORY_TRACT | 4 refills | Status: DC

## 2021-05-24 NOTE — Telephone Encounter (Signed)
done

## 2021-05-24 NOTE — Telephone Encounter (Signed)
Pt just left and he is needing his albuterol to go to [facility] on [address], not the mail order service

## 2021-05-24 NOTE — Patient Instructions (Signed)
Asthma Attack Prevention, Adult Although you may not be able to control the fact that you have asthma, you can take actions to prevent episodes of asthma (asthma attacks). How can this condition affect me? Asthma attacks (flare ups) can cause trouble breathing, wheezing, and coughing. They may keep you from doing activities you like to do. What can increase my risk? Coming into contact with things that cause asthma symptoms (asthma triggers) can put you at risk for an asthma attack. Common asthma triggers include:  Things you are allergic to (allergens), such as: ? Dust mite and cockroach droppings. ? Pet dander. ? Mold. ? Pollen from trees and grasses. ? Food allergies. This might be a specific food or added chemicals called sulfites.  Irritants, such as: ? Weather changes including very cold, dry, or humid air. ? Smoke. This includes campfire smoke, air pollution, and tobacco smoke. ? Strong odors from aerosol sprays and fumes from perfume, candles, and household cleaners.  Other triggers, such as: ? Certain medicines. This includes NSAIDs, such as ibuprofen and aspirin. ? Viral respiratory infections (colds), including runny nose (rhinitis) or infection in the sinuses (sinusitis). ? Activity including exercise, laughing, or crying. ? Not using inhaled medicines (corticosteroids) as told. What actions can I take to prevent an asthma attack?  Stay healthy. Stay up to date on all immunizations as told by your health care provider, including the yearly flu (influenza) vaccine and pneumonia vaccine.  Many asthma attacks can be prevented by carefully following your written asthma action plan. Follow your asthma action plan Work with your health care provider to create a written asthma action plan. This plan should include:  A list of your asthma triggers and how to avoid or reduce them.  A list of symptoms that you may have during an asthma attack.  Information about which medicine  to take, when to take the medicine, and how much of the medicine to take.  Information to help you understand your peak flow measurements.  Daily actions that you can take to prevent (control) your asthma symptoms.  Contact information for your health care providers.  If you have an asthma attack, act quickly. Follow the emergency steps on your written asthma action plan. This may prevent you from needing to go to the hospital. Monitor your asthma. To do this:  Use your peak flow meter every morning and every evening for 2-3 weeks or as told by your health care provider. ? Record the results in a journal. ? A drop in your peak flow numbers on one or more days may mean that you are starting to have an asthma attack, even if you are not having symptoms.  When you have asthma symptoms, write them down in a journal.  Write down in your journal how often you need to use your fast-acting rescue inhaler. If you are using your rescue inhaler more often, it may mean that your asthma is not under control. Talk with your health care provider about adjusting your asthma treatment plan to help you prevent future asthma attacks and gain better control of your condition.   Lifestyle  Avoid or reduce contact with known outdoor allergens by staying indoors, keeping windows closed, and using air conditioning when pollen and mold counts are high.  Do not use any products that contain nicotine or tobacco, such as cigarettes, e-cigarettes, and chewing tobacco. If you need help quitting, ask your health care provider.  If you are overweight, consider a weight-loss plan.  Find  ways to cope with stress and your feelings, such as mindfulness, relaxation, or breathing exercises.  Ask your health care provider if a breathing exercise program (pulmonary rehabilitation) may be helpful to control symptoms and improve your quality of life. Medicines  Take over-the-counter and prescription medicines only as told by  your health care provider.  Do not stop taking your medicine and do not take less medicine even if you are doing well.  Let your health care provider know: ? How often you use your rescue inhaler. ? How often you have symptoms when you are taking your regular medicines. ? If you wake up at night because of asthma symptoms. ? If you have more trouble with your breathing when you exercise.   Activity  Do your normal activities as told by your health care provider. Ask your health care provider what activities are safe for you.  Some people have asthma symptoms or more asthma symptoms when they exercise. This is called exercise-induced bronchoconstriction (EIB). If you have this problem, talk with your health care provider about how to manage EIB. Some tips to follow include: ? Use your fast-acting inhaler before exercise. ? Exercise indoors if it is very cold, humid, or the pollen and mold counts are high. ? Warm up and cool down before and after exercise. ? Stop exercising right away if your asthma symptoms start or get worse. Where to find more information  Asthma and Allergy Foundation of America: www.aafa.org  Centers for Disease Control and Prevention: FootballExhibition.com.br  American Lung Association: www.lung.org  National Heart, Lung, and Blood Institute: PopSteam.is  World Health Organization: https://castaneda-walker.com/ Get help right away if:  You have followed your written asthma action plan and your symptoms are not improving. Summary  Asthma attacks (flare ups) can cause trouble breathing, wheezing, and coughing. They may keep you from doing activities you normally like to do.  Work with your health care provider to create a written asthma action plan.  Do not stop taking your medicine and do not take less medicine even if you are doing well.  Do not use any products that contain nicotine or tobacco, such as cigarettes, e-cigarettes, and chewing tobacco. If you need help quitting, ask  your health care provider. This information is not intended to replace advice given to you by your health care provider. Make sure you discuss any questions you have with your health care provider. Document Revised: 12/08/2019 Document Reviewed: 12/08/2019 Elsevier Patient Education  2021 Elsevier Inc.  http://www.aaaai.org/conditions-and-treatments/asthma">  Asthma, Adult  Asthma is a long-term (chronic) condition that causes recurrent episodes in which the airways become tight and narrow. The airways are the passages that lead from the nose and mouth down into the lungs. Asthma episodes, also called asthma attacks, can cause coughing, wheezing, shortness of breath, and chest pain. The airways can also fill with mucus. During an attack, it can be difficult to breathe. Asthma attacks can range from minor to life threatening. Asthma cannot be cured, but medicines and lifestyle changes can help control it and treat acute attacks. What are the causes? This condition is believed to be caused by inherited (genetic) and environmental factors, but its exact cause is not known. There are many things that can bring on an asthma attack or make asthma symptoms worse (triggers). Asthma triggers are different for each person. Common triggers include:  Mold.  Dust.  Cigarette smoke.  Cockroaches.  Things that can cause allergy symptoms (allergens), such as animal dander or pollen from  trees or grass.  Air pollutants such as household cleaners, wood smoke, smog, or Therapist, occupational.  Cold air, weather changes, and winds (which increase molds and pollen in the air).  Strong emotional expressions such as crying or laughing hard.  Stress.  Certain medicines (such as aspirin) or types of medicines (such as beta-blockers).  Sulfites in foods and drinks. Foods and drinks that may contain sulfites include dried fruit, potato chips, and sparkling grape juice.  Infections or inflammatory conditions such  as the flu, a cold, or inflammation of the nasal membranes (rhinitis).  Gastroesophageal reflux disease (GERD).  Exercise or strenuous activity. What are the signs or symptoms? Symptoms of this condition may occur right after asthma is triggered or many hours later. Symptoms include:  Wheezing. This can sound like whistling when you breathe.  Excessive nighttime or early morning coughing.  Frequent or severe coughing with a common cold.  Chest tightness.  Shortness of breath.  Tiredness (fatigue) with minimal activity. How is this diagnosed? This condition is diagnosed based on:  Your medical history.  A physical exam.  Tests, which may include: ? Lung function studies and pulmonary studies (spirometry). These tests can evaluate the flow of air in your lungs. ? Allergy tests. ? Imaging tests, such as X-rays. How is this treated? There is no cure for this condition, but treatment can help control your symptoms. Treatment for asthma usually involves:  Identifying and avoiding your asthma triggers.  Using medicines to control your symptoms. Generally, two types of medicines are used to treat asthma: ? Controller medicines. These help prevent asthma symptoms from occurring. They are usually taken every day. ? Fast-acting reliever or rescue medicines. These quickly relieve asthma symptoms by widening the narrow and tight airways. They are used as needed and provide short-term relief.  Using supplemental oxygen. This may be needed during a severe episode.  Using other medicines, such as: ? Allergy medicines, such as antihistamines, if your asthma attacks are triggered by allergens. ? Immune medicines (immunomodulators). These are medicines that help control the immune system.  Creating an asthma action plan. An asthma action plan is a written plan for managing and treating your asthma attacks. This plan includes: ? A list of your asthma triggers and how to avoid  them. ? Information about when medicines should be taken and when their dosage should be changed. ? Instructions about using a device called a peak flow meter. A peak flow meter measures how well the lungs are working and the severity of your asthma. It helps you monitor your condition. Follow these instructions at home: Controlling your home environment Control your home environment in the following ways to help avoid triggers and prevent asthma attacks:  Change your heating and air conditioning filter regularly.  Limit your use of fireplaces and wood stoves.  Get rid of pests (such as roaches and mice) and their droppings.  Throw away plants if you see mold on them.  Clean floors and dust surfaces regularly. Use unscented cleaning products.  Try to have someone else vacuum for you regularly. Stay out of rooms while they are being vacuumed and for a short while afterward. If you vacuum, use a dust mask from a hardware store, a double-layered or microfilter vacuum cleaner bag, or a vacuum cleaner with a HEPA filter.  Replace carpet with wood, tile, or vinyl flooring. Carpet can trap dander and dust.  Use allergy-proof pillows, mattress covers, and box spring covers.  Keep your bedroom a trigger-free  room.  Avoid pets and keep windows closed when allergens are in the air.  Wash beddings every week in hot water and dry them in a dryer.  Use blankets that are made of polyester or cotton.  Clean bathrooms and kitchens with bleach. If possible, have someone repaint the walls in these rooms with mold-resistant paint. Stay out of the rooms that are being cleaned and painted.  Wash your hands often with soap and water. If soap and water are not available, use hand sanitizer.  Do not allow anyone to smoke in your home. General instructions  Take over-the-counter and prescription medicines only as told by your health care provider. ? Speak with your health care provider if you have  questions about how or when to take the medicines. ? Make note if you are requiring more frequent dosages.  Do not use any products that contain nicotine or tobacco, such as cigarettes and e-cigarettes. If you need help quitting, ask your health care provider. Also, avoid being exposed to secondhand smoke.  Use a peak flow meter as told by your health care provider. Record and keep track of the readings.  Understand and use the asthma action plan to help minimize, or stop an asthma attack, without needing to seek medical care.  Make sure you stay up to date on your yearly vaccinations as told by your health care provider. This may include vaccines for the flu and pneumonia.  Avoid outdoor activities when allergen counts are high and when air quality is low.  Wear a ski mask that covers your nose and mouth during outdoor winter activities. Exercise indoors on cold days if you can.  Warm up before exercising, and take time for a cool-down period after exercise.  Keep all follow-up visits as told by your health care provider. This is important. Where to find more information  For information about asthma, turn to the Centers for Disease Control and Prevention at http://www.mills-berg.com/www.cdc.gov/asthma/faqs  For air quality information, turn to AirNow at http://hood.com/airnow.gov Contact a health care provider if:  You have wheezing, shortness of breath, or a cough even while you are taking medicine to prevent attacks.  The mucus you cough up (sputum) is thicker than usual.  Your sputum changes from clear or white to yellow, green, gray, or bloody.  Your medicines are causing side effects, such as a rash, itching, swelling, or trouble breathing.  You need to use a reliever medicine more than 2-3 times a week.  Your peak flow reading is still at 50-79% of your personal best after following your action plan for 1 hour.  You have a fever. Get help right away if:  You are getting worse and do not respond to treatment  during an asthma attack.  You are short of breath when at rest or when doing very little physical activity.  You have difficulty eating, drinking, or talking.  You have chest pain or tightness.  You develop a fast heartbeat or palpitations.  You have a bluish color to your lips or fingernails.  You are light-headed or dizzy, or you faint.  Your peak flow reading is less than 50% of your personal best.  You feel too tired to breathe normally. Summary  Asthma is a long-term (chronic) condition that causes recurrent episodes in which the airways become tight and narrow. These episodes can cause coughing, wheezing, shortness of breath, and chest pain.  Asthma cannot be cured, but medicines and lifestyle changes can help control it and treat  acute attacks.  Make sure you understand how to avoid triggers and how and when to use your medicines.  Asthma attacks can range from minor to life threatening. Get help right away if you have an asthma attack and do not respond to treatment with your usual rescue medicines. This information is not intended to replace advice given to you by your health care provider. Make sure you discuss any questions you have with your health care provider. Document Revised: 09/09/2020 Document Reviewed: 04/13/2020 Elsevier Patient Education  2021 [employer].
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
## 2021-05-25 LAB — CBC WITH DIFFERENTIAL/PLATELET
Absolute Monocytes: 475 cells/uL (ref 200–950)
Basophils Absolute: 19 cells/uL (ref 0–200)
Basophils Relative: 0.4 %
Eosinophils Absolute: 230 cells/uL (ref 15–500)
Eosinophils Relative: 4.8 %
HCT: 48.8 % (ref 38.5–50.0)
Hemoglobin: 15.7 g/dL (ref 13.2–17.1)
Lymphs Abs: 1181 cells/uL (ref 850–3900)
MCH: 26.8 pg — ABNORMAL LOW (ref 27.0–33.0)
MCHC: 32.2 g/dL (ref 32.0–36.0)
MCV: 83.3 fL (ref 80.0–100.0)
MPV: 10 fL (ref 7.5–12.5)
Monocytes Relative: 9.9 %
Neutro Abs: 2894 cells/uL (ref 1500–7800)
Neutrophils Relative %: 60.3 %
Platelets: 140 10*3/uL (ref 140–400)
RBC: 5.86 10*6/uL — ABNORMAL HIGH (ref 4.20–5.80)
RDW: 13.1 % (ref 11.0–15.0)
Total Lymphocyte: 24.6 %
WBC: 4.8 10*3/uL (ref 3.8–10.8)

## 2021-05-25 LAB — COMPLETE METABOLIC PANEL WITHOUT GFR
AG Ratio: 1.6 (calc) (ref 1.0–2.5)
ALT: 16 U/L (ref 9–46)
AST: 20 U/L (ref 10–40)
Albumin: 4.5 g/dL (ref 3.6–5.1)
Alkaline phosphatase (APISO): 58 U/L (ref 36–130)
BUN: 9 mg/dL (ref 7–25)
CO2: 29 mmol/L (ref 20–32)
Calcium: 9.8 mg/dL (ref 8.6–10.3)
Chloride: 102 mmol/L (ref 98–110)
Creat: 1.25 mg/dL (ref 0.60–1.35)
GFR, Est African American: 88 mL/min/1.73m2
GFR, Est Non African American: 76 mL/min/1.73m2
Globulin: 2.9 g/dL (ref 1.9–3.7)
Glucose, Bld: 108 mg/dL — ABNORMAL HIGH (ref 65–99)
Potassium: 3.9 mmol/L (ref 3.5–5.3)
Sodium: 141 mmol/L (ref 135–146)
Total Bilirubin: 0.9 mg/dL (ref 0.2–1.2)
Total Protein: 7.4 g/dL (ref 6.1–8.1)

## 2021-05-25 LAB — HIV-1 RNA ULTRAQUANT REFLEX TO GENTYP+
HIV 1 RNA Quant: 264 {copies}/mL — ABNORMAL HIGH
HIV-1 RNA Quant, Log: 2.42 {Log_copies}/mL — ABNORMAL HIGH

## 2021-06-02 ENCOUNTER — Ambulatory Visit: Payer: Self-pay | Admitting: Infectious Disease

## 2021-06-09 ENCOUNTER — Other Ambulatory Visit (HOSPITAL_COMMUNITY): Payer: Self-pay

## 2021-06-13 ENCOUNTER — Other Ambulatory Visit (HOSPITAL_COMMUNITY): Payer: Self-pay

## 2021-06-13 ENCOUNTER — Other Ambulatory Visit: Payer: Self-pay | Admitting: Pharmacist

## 2021-06-13 DIAGNOSIS — B20 Human immunodeficiency virus [HIV] disease: Secondary | ICD-10-CM

## 2021-06-13 MED ORDER — ODEFSEY 200-25-25 MG PO TABS: 1.0000 | ORAL_TABLET | Freq: Every day | ORAL | 0 refills | Status: DC

## 2021-06-13 NOTE — Progress Notes (Signed)
Patient's insurance requires Odefsey to be filled at [facility]. Resending Rx now. Lupita Leash will call patient and coordinate. Patient will get 30 days and will follow up with Dr. Daiva Eves on 7/14.

## 2021-06-14 ENCOUNTER — Other Ambulatory Visit: Payer: Self-pay | Admitting: Pharmacist

## 2021-06-14 ENCOUNTER — Other Ambulatory Visit (HOSPITAL_COMMUNITY): Payer: Self-pay

## 2021-06-14 DIAGNOSIS — B20 Human immunodeficiency virus [HIV] disease: Secondary | ICD-10-CM

## 2021-06-14 MED ORDER — ODEFSEY 200-25-25 MG PO TABS
1.0000 | ORAL_TABLET | Freq: Every day | ORAL | 0 refills | Status: DC
Start: 2021-06-14 — End: 2021-07-24

## 2021-06-14 NOTE — Progress Notes (Signed)
Patient's insurance actually requires that he fills his Odefsey at [facility]. Sending now.

## 2021-06-27 ENCOUNTER — Ambulatory Visit: Payer: No Typology Code available for payment source | Admitting: Family Medicine

## 2021-06-27 ENCOUNTER — Other Ambulatory Visit: Payer: Self-pay

## 2021-07-06 ENCOUNTER — Ambulatory Visit: Payer: Self-pay | Admitting: Infectious Disease

## 2021-07-06 ENCOUNTER — Telehealth: Payer: Self-pay | Admitting: Family Medicine

## 2021-07-06 DIAGNOSIS — J4550 Severe persistent asthma, uncomplicated: Secondary | ICD-10-CM

## 2021-07-06 MED ORDER — FLUTICASONE-SALMETEROL 250-50 MCG/ACT IN AEPB: 1.0000 | INHALATION_SPRAY | Freq: Two times a day (BID) | RESPIRATORY_TRACT | 4 refills | Status: DC

## 2021-07-06 NOTE — Telephone Encounter (Signed)
Refill sent in

## 2021-07-06 NOTE — Telephone Encounter (Signed)
What is the name of the medication? fluticasone-salmeterol (ADVAIR DISKUS) 250-50 MCG/ACT AEPB [[id number]] and a prescription that is similar to [employer] does not cover this.   Have you contacted your pharmacy to request a refill? Yes, he is needing a refill on this script .  Which pharmacy would you like this sent to? [facility]    Address: Main Floor, [address], [place], [place] [address]   Patient notified that their request is being sent to the clinical staff for review and that they should receive a call once it is complete. If they do not receive a call within 72 hours they can check with their pharmacy or our office.

## 2021-07-21 ENCOUNTER — Other Ambulatory Visit: Payer: Self-pay | Admitting: Pharmacist

## 2021-07-21 DIAGNOSIS — B20 Human immunodeficiency virus [HIV] disease: Secondary | ICD-10-CM

## 2021-07-31 ENCOUNTER — Other Ambulatory Visit: Payer: Self-pay

## 2021-07-31 ENCOUNTER — Ambulatory Visit (INDEPENDENT_AMBULATORY_CARE_PROVIDER_SITE_OTHER): Payer: No Typology Code available for payment source | Admitting: Infectious Disease

## 2021-07-31 VITALS — BP 126/81 | HR 65 | Temp 98.0°F | Wt 166.0 lb

## 2021-07-31 DIAGNOSIS — B20 Human immunodeficiency virus [HIV] disease: Secondary | ICD-10-CM | POA: Diagnosis not present

## 2021-07-31 DIAGNOSIS — A630 Anogenital (venereal) warts: Secondary | ICD-10-CM

## 2021-07-31 DIAGNOSIS — F4321 Adjustment disorder with depressed mood: Secondary | ICD-10-CM

## 2021-07-31 DIAGNOSIS — J4550 Severe persistent asthma, uncomplicated: Secondary | ICD-10-CM | POA: Diagnosis not present

## 2021-07-31 DIAGNOSIS — J302 Other seasonal allergic rhinitis: Secondary | ICD-10-CM

## 2021-07-31 DIAGNOSIS — Z113 Encounter for screening for infections with a predominantly sexual mode of transmission: Secondary | ICD-10-CM

## 2021-07-31 MED ORDER — ODEFSEY 200-25-25 MG PO TABS: 1.0000 | ORAL_TABLET | Freq: Every day | ORAL | 11 refills | Status: DC
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
## 2021-08-02 LAB — HIV-1 RNA QUANT-NO REFLEX-BLD
HIV 1 RNA Quant: <20 Copies/mL — ABNORMAL HIGH
HIV-1 RNA Quant, Log: <1.3 Log cps/mL — ABNORMAL HIGH

## 2022-01-22 ENCOUNTER — Ambulatory Visit (INDEPENDENT_AMBULATORY_CARE_PROVIDER_SITE_OTHER): Payer: Self-pay | Admitting: Infectious Disease

## 2022-01-22 ENCOUNTER — Other Ambulatory Visit (HOSPITAL_COMMUNITY): Payer: Self-pay

## 2022-01-22 ENCOUNTER — Encounter: Payer: Self-pay | Admitting: Infectious Disease

## 2022-01-22 ENCOUNTER — Other Ambulatory Visit (HOSPITAL_COMMUNITY)
Admission: RE | Admit: 2022-01-22 | Discharge: 2022-01-22 | Disposition: A | Discharge status: Home / Self Care | Payer: No Typology Code available for payment source | Source: Ambulatory Visit | Attending: Infectious Disease | Admitting: Infectious Disease

## 2022-01-22 ENCOUNTER — Other Ambulatory Visit: Payer: Self-pay

## 2022-01-22 VITALS — BP 115/73 | HR 78 | Temp 98.5°F | Ht 64.0 in | Wt 168.0 lb

## 2022-01-22 DIAGNOSIS — B20 Human immunodeficiency virus [HIV] disease: Secondary | ICD-10-CM | POA: Insufficient documentation

## 2022-01-22 DIAGNOSIS — E7800 Pure hypercholesterolemia, unspecified: Secondary | ICD-10-CM | POA: Diagnosis not present

## 2022-01-22 DIAGNOSIS — J4550 Severe persistent asthma, uncomplicated: Secondary | ICD-10-CM | POA: Diagnosis not present

## 2022-01-22 MED ORDER — DOVATO 50-300 MG PO TABS: 1.0000 | ORAL_TABLET | Freq: Every day | ORAL | 11 refills | Status: DC
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
## 2022-01-23 LAB — URINE CYTOLOGY ANCILLARY ONLY
Chlamydia: NEGATIVE
Comment: NEGATIVE
Comment: NORMAL
Neisseria Gonorrhea: NEGATIVE

## 2022-01-23 LAB — T-HELPER CELL (CD4) - (RCID CLINIC ONLY)
CD4 % Helper T Cell: 40 % (ref 33–65)
CD4 T Cell Abs: 579 /uL (ref 400–1790)

## 2022-01-24 LAB — COMPLETE METABOLIC PANEL WITH GFR
AG Ratio: 2.1 (calc) (ref 1.0–2.5)
ALT: 14 U/L (ref 9–46)
AST: 13 U/L (ref 10–40)
Albumin: 4.4 g/dL (ref 3.6–5.1)
Alkaline phosphatase (APISO): 43 U/L (ref 36–130)
BUN: 13 mg/dL (ref 7–25)
CO2: 30 mmol/L (ref 20–32)
Calcium: 9.1 mg/dL (ref 8.6–10.3)
Chloride: 108 mmol/L (ref 98–110)
Creat: 1.04 mg/dL (ref 0.60–1.26)
Globulin: 2.1 g/dL (calc) (ref 1.9–3.7)
Glucose, Bld: 94 mg/dL (ref 65–99)
Potassium: 4.1 mmol/L (ref 3.5–5.3)
Sodium: 142 mmol/L (ref 135–146)
Total Bilirubin: 0.5 mg/dL (ref 0.2–1.2)
Total Protein: 6.5 g/dL (ref 6.1–8.1)
eGFR: 97 mL/min/{1.73_m2} (ref >=60)

## 2022-01-24 LAB — CBC WITH DIFFERENTIAL/PLATELET
Absolute Monocytes: 416 cells/uL (ref 200–950)
Basophils Absolute: 32 cells/uL (ref 0–200)
Basophils Relative: 0.6 %
Eosinophils Absolute: 427 cells/uL (ref 15–500)
Eosinophils Relative: 7.9 %
HCT: 44.1 % (ref 38.5–50.0)
Hemoglobin: 14.6 g/dL (ref 13.2–17.1)
Lymphs Abs: 1658 cells/uL (ref 850–3900)
MCH: 28 pg (ref 27.0–33.0)
MCHC: 33.1 g/dL (ref 32.0–36.0)
MCV: 84.6 fL (ref 80.0–100.0)
MPV: 10.5 fL (ref 7.5–12.5)
Monocytes Relative: 7.7 %
Neutro Abs: 2867 cells/uL (ref 1500–7800)
Neutrophils Relative %: 53.1 %
Platelets: 144 10*3/uL (ref 140–400)
RBC: 5.21 10*6/uL (ref 4.20–5.80)
RDW: 13.5 % (ref 11.0–15.0)
Total Lymphocyte: 30.7 %
WBC: 5.4 10*3/uL (ref 3.8–10.8)

## 2022-01-24 LAB — LIPID PANEL
Cholesterol: 143 mg/dL (ref <200)
HDL: 39 mg/dL — ABNORMAL LOW (ref >=40)
LDL Cholesterol (Calc): 84 mg/dL (calc)
Non-HDL Cholesterol (Calc): 104 mg/dL (calc) (ref <130)
Total CHOL/HDL Ratio: 3.7 (calc) (ref <5.0)
Triglycerides: 100 mg/dL (ref <150)

## 2022-01-24 LAB — HIV-1 RNA QUANT-NO REFLEX-BLD
HIV 1 RNA Quant: NOT DETECTED Copies/mL
HIV-1 RNA Quant, Log: NOT DETECTED Log cps/mL

## 2022-01-24 LAB — RPR: RPR Ser Ql: NONREACTIVE

## 2022-01-24 LAB — HEPATITIS B SURFACE ANTIBODY, QUANTITATIVE: Hep B S AB Quant (Post): 100 m[IU]/mL (ref >=10)

## 2022-01-25 ENCOUNTER — Other Ambulatory Visit: Payer: Self-pay | Admitting: Pharmacist

## 2022-01-25 DIAGNOSIS — B20 Human immunodeficiency virus [HIV] disease: Secondary | ICD-10-CM

## 2022-01-25 MED ORDER — DOVATO 50-300 MG PO TABS: 1.0000 | ORAL_TABLET | Freq: Every day | ORAL | 0 refills | Status: AC

## 2022-01-25 NOTE — Progress Notes (Signed)
Medication Samples have been provided to the patient.  Drug name: Dovato        Strength: 50/300 mg         Qty: 56 LOT: CL4G   Exp.Date: 05/25/23  Dosing instructions: Take one tablet by mouth once daily  The patient has been instructed regarding the correct time, dose, and frequency of taking this medication, including desired effects and most common side effects.   Jerimah Witucki L. Jannette Fogo, PharmD, BCIDP, AAHIVP, CPP Clinical Pharmacist Practitioner Infectious Diseases Clinical Pharmacist [facility] 12/05/2020, 10:07 AM

## 2022-03-19 ENCOUNTER — Other Ambulatory Visit (HOSPITAL_COMMUNITY): Payer: Self-pay

## 2022-03-19 ENCOUNTER — Ambulatory Visit (INDEPENDENT_AMBULATORY_CARE_PROVIDER_SITE_OTHER): Payer: Self-pay | Admitting: Infectious Disease

## 2022-03-19 ENCOUNTER — Other Ambulatory Visit: Payer: Self-pay

## 2022-03-19 ENCOUNTER — Encounter: Payer: Self-pay | Admitting: Infectious Disease

## 2022-03-19 VITALS — BP 110/69 | HR 61 | Temp 98.1°F | Ht 66.0 in | Wt 159.0 lb

## 2022-03-19 DIAGNOSIS — B20 Human immunodeficiency virus [HIV] disease: Secondary | ICD-10-CM

## 2022-03-19 DIAGNOSIS — J302 Other seasonal allergic rhinitis: Secondary | ICD-10-CM

## 2022-03-19 DIAGNOSIS — J45990 Exercise induced bronchospasm: Secondary | ICD-10-CM

## 2022-03-19 DIAGNOSIS — Z113 Encounter for screening for infections with a predominantly sexual mode of transmission: Secondary | ICD-10-CM

## 2022-03-19 MED ORDER — DOVATO 50-300 MG PO TABS: 1.0000 | ORAL_TABLET | Freq: Every day | ORAL | 11 refills | Status: DC
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
## 2022-03-20 LAB — T-HELPER CELLS (CD4) COUNT (NOT AT ARMC)
CD4 % Helper T Cell: 36 % (ref 33–65)
CD4 T Cell Abs: 630 /uL (ref 400–1790)

## 2022-03-21 LAB — COMPLETE METABOLIC PANEL WITH GFR
AG Ratio: 1.9 (calc) (ref 1.0–2.5)
ALT: 13 U/L (ref 9–46)
AST: 12 U/L (ref 10–40)
Albumin: 4.4 g/dL (ref 3.6–5.1)
Alkaline phosphatase (APISO): 44 U/L (ref 36–130)
BUN/Creatinine Ratio: 10 (calc) (ref 6–22)
BUN: 13 mg/dL (ref 7–25)
CO2: 29 mmol/L (ref 20–32)
Calcium: 8.9 mg/dL (ref 8.6–10.3)
Chloride: 105 mmol/L (ref 98–110)
Creat: 1.28 mg/dL — ABNORMAL HIGH (ref 0.60–1.26)
Globulin: 2.3 g/dL (calc) (ref 1.9–3.7)
Glucose, Bld: 85 mg/dL (ref 65–99)
Potassium: 3.9 mmol/L (ref 3.5–5.3)
Sodium: 141 mmol/L (ref 135–146)
Total Bilirubin: 1.3 mg/dL — ABNORMAL HIGH (ref 0.2–1.2)
Total Protein: 6.7 g/dL (ref 6.1–8.1)
eGFR: 76 mL/min/{1.73_m2} (ref >=60)

## 2022-03-21 LAB — CBC WITH DIFFERENTIAL/PLATELET
Absolute Monocytes: 539 cells/uL (ref 200–950)
Basophils Absolute: 31 cells/uL (ref 0–200)
Basophils Relative: 0.5 %
Eosinophils Absolute: 360 cells/uL (ref 15–500)
Eosinophils Relative: 5.8 %
HCT: 41.8 % (ref 38.5–50.0)
Hemoglobin: 13.8 g/dL (ref 13.2–17.1)
Lymphs Abs: 2077 cells/uL (ref 850–3900)
MCH: 28.8 pg (ref 27.0–33.0)
MCHC: 33 g/dL (ref 32.0–36.0)
MCV: 87.3 fL (ref 80.0–100.0)
MPV: 10.6 fL (ref 7.5–12.5)
Monocytes Relative: 8.7 %
Neutro Abs: 3193 cells/uL (ref 1500–7800)
Neutrophils Relative %: 51.5 %
Platelets: 151 10*3/uL (ref 140–400)
RBC: 4.79 10*6/uL (ref 4.20–5.80)
RDW: 13 % (ref 11.0–15.0)
Total Lymphocyte: 33.5 %
WBC: 6.2 10*3/uL (ref 3.8–10.8)

## 2022-03-21 LAB — HIV-1 RNA QUANT-NO REFLEX-BLD
HIV 1 RNA Quant: NOT DETECTED Copies/mL
HIV-1 RNA Quant, Log: NOT DETECTED Log cps/mL

## 2022-03-21 LAB — RPR: RPR Ser Ql: NONREACTIVE

## 2022-04-09 ENCOUNTER — Ambulatory Visit: Payer: Self-pay | Admitting: Physician Assistant

## 2022-04-25 ENCOUNTER — Other Ambulatory Visit (HOSPITAL_COMMUNITY): Payer: Self-pay

## 2022-05-30 ENCOUNTER — Encounter: Payer: Self-pay | Admitting: Infectious Disease

## 2022-05-31 ENCOUNTER — Encounter: Payer: Self-pay | Admitting: Infectious Disease

## 2022-06-04 ENCOUNTER — Other Ambulatory Visit: Payer: Self-pay

## 2022-06-04 DIAGNOSIS — B20 Human immunodeficiency virus [HIV] disease: Secondary | ICD-10-CM

## 2022-06-04 MED ORDER — DOVATO 50-300 MG PO TABS: 1.0000 | ORAL_TABLET | Freq: Every day | ORAL | 5 refills | Status: AC

## 2022-06-04 NOTE — Progress Notes (Signed)
Received call from pharmacist at [facility] (phone [phone]), stating patient's HMAP coverage was active and requesting Rx for Dovato be transferred to their pharmacy. Called patient and confirmed he wanted his Dovato to be mailed. Made patient aware that he needed to answer pharmacy calls in order to ensure delivery. Patient had no additional questions.  Binnie Kand, RN

## 2022-06-05 ENCOUNTER — Other Ambulatory Visit (HOSPITAL_COMMUNITY): Payer: Self-pay

## 2022-06-12 ENCOUNTER — Other Ambulatory Visit: Payer: Self-pay | Admitting: Pharmacist

## 2022-06-12 DIAGNOSIS — B20 Human immunodeficiency virus [HIV] disease: Secondary | ICD-10-CM

## 2022-06-12 MED ORDER — DOVATO 50-300 MG PO TABS
1.0000 | ORAL_TABLET | Freq: Every day | ORAL | 0 refills | Status: AC
Start: 2022-06-01 — End: 2022-06-15

## 2022-06-12 NOTE — Progress Notes (Signed)
Medication Samples have been provided to the patient.  Drug name: Dovato        Strength: 50/300 mg         Qty: 14  Tablets (1 bottles) LOT: PP3C   Exp.Date: 11/24  Dosing instructions: Take one tablet by mouth once daily  The patient has been instructed regarding the correct time, dose, and frequency of taking this medication, including desired effects and most common side effects.   Margarite Gouge, PharmD, CPP Clinical Pharmacist Practitioner Infectious Diseases Clinical Pharmacist [facility] Infectious [facility]

## 2022-08-29 ENCOUNTER — Other Ambulatory Visit: Payer: Self-pay | Admitting: Family Medicine

## 2022-11-13 ENCOUNTER — Encounter: Payer: Self-pay | Admitting: Infectious Disease

## 2022-11-13 DIAGNOSIS — N529 Male erectile dysfunction, unspecified: Secondary | ICD-10-CM | POA: Insufficient documentation

## 2022-11-13 HISTORY — DX: Male erectile dysfunction, unspecified: N52.9
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
## 2022-11-14 ENCOUNTER — Ambulatory Visit: Payer: Self-pay | Admitting: Infectious Disease

## 2022-11-14 DIAGNOSIS — N529 Male erectile dysfunction, unspecified: Secondary | ICD-10-CM

## 2022-11-14 DIAGNOSIS — J4550 Severe persistent asthma, uncomplicated: Secondary | ICD-10-CM

## 2022-11-14 DIAGNOSIS — B20 Human immunodeficiency virus [HIV] disease: Secondary | ICD-10-CM

## 2022-11-14 DIAGNOSIS — Z113 Encounter for screening for infections with a predominantly sexual mode of transmission: Secondary | ICD-10-CM

## 2022-12-25 NOTE — Telephone Encounter (Signed)
Release of records received: none, noted in care everywhere that patient has established care with [facility]   Last visit at [facility]: 02/2022 Lab:  Lab Results  Component Value Date   HIV1RNAQUANT Not Detected 03/19/2022     Staff notified team with [facility] of transfer of care.  Eugenia Mcalpine, LPN
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
## 2023-12-30 NOTE — Progress Notes (Signed)
 Patient safety screening completed and documented. Patient denies any need for a depression screening at this time.

## 2023-12-30 NOTE — Progress Notes (Signed)
 Urine taken to lab for processing.Heron Uvaldo Rayas, LPN

## 2024-07-10 NOTE — Telephone Encounter (Signed)
 Mirabegron isn't covered by patient's insurance.  If appropriate please send a new prescription for an alternative.  Alternatives: -fesoterodine ER tablet -oxybutynin solution / syrup / tablet / ER tablet -solifenacin tablet -tolterodine tablet / ER capsule   Thanks! Merilee Elnor Canner, [facility]

## 2024-07-13 NOTE — Telephone Encounter (Signed)
 Reviewed chart, patient has not tried or failed any of the below alternatives.

## 2024-09-10 ENCOUNTER — Emergency Department (HOSPITAL_COMMUNITY)

## 2024-09-10 ENCOUNTER — Inpatient Hospital Stay (HOSPITAL_COMMUNITY)
Admission: EM | Admit: 2024-09-10 | Discharge: 2024-09-12 | DRG: 948 | Disposition: A | Discharge status: Home / Self Care | Attending: Family Medicine | Admitting: Family Medicine

## 2024-09-10 ENCOUNTER — Emergency Department (HOSPITAL_COMMUNITY): Payer: Self-pay

## 2024-09-10 ENCOUNTER — Other Ambulatory Visit: Payer: Self-pay

## 2024-09-10 DIAGNOSIS — J45909 Unspecified asthma, uncomplicated: Secondary | ICD-10-CM | POA: Diagnosis present

## 2024-09-10 DIAGNOSIS — Z21 Asymptomatic human immunodeficiency virus [HIV] infection status: Secondary | ICD-10-CM | POA: Diagnosis present

## 2024-09-10 DIAGNOSIS — Z634 Disappearance and death of family member: Secondary | ICD-10-CM

## 2024-09-10 DIAGNOSIS — R4182 Altered mental status, unspecified: Principal | ICD-10-CM | POA: Diagnosis present

## 2024-09-10 DIAGNOSIS — Z789 Other specified health status: Secondary | ICD-10-CM

## 2024-09-10 DIAGNOSIS — Z7951 Long term (current) use of inhaled steroids: Secondary | ICD-10-CM

## 2024-09-10 DIAGNOSIS — F940 Selective mutism: Secondary | ICD-10-CM | POA: Diagnosis present

## 2024-09-10 DIAGNOSIS — S60512A Abrasion of left hand, initial encounter: Secondary | ICD-10-CM | POA: Diagnosis present

## 2024-09-10 DIAGNOSIS — F909 Attention-deficit hyperactivity disorder, unspecified type: Secondary | ICD-10-CM | POA: Diagnosis present

## 2024-09-10 DIAGNOSIS — R509 Fever, unspecified: Secondary | ICD-10-CM | POA: Diagnosis present

## 2024-09-10 DIAGNOSIS — F12920 Cannabis use, unspecified with intoxication, uncomplicated: Secondary | ICD-10-CM | POA: Diagnosis present

## 2024-09-10 DIAGNOSIS — Z79899 Other long term (current) drug therapy: Secondary | ICD-10-CM

## 2024-09-10 DIAGNOSIS — Z23 Encounter for immunization: Secondary | ICD-10-CM

## 2024-09-10 DIAGNOSIS — D849 Immunodeficiency, unspecified: Secondary | ICD-10-CM | POA: Diagnosis present

## 2024-09-10 DIAGNOSIS — Z91013 Allergy to seafood: Secondary | ICD-10-CM

## 2024-09-10 DIAGNOSIS — Y92410 Unspecified street and highway as the place of occurrence of the external cause: Secondary | ICD-10-CM

## 2024-09-10 DIAGNOSIS — F4325 Adjustment disorder with mixed disturbance of emotions and conduct: Secondary | ICD-10-CM | POA: Diagnosis present

## 2024-09-10 DIAGNOSIS — F4320 Adjustment disorder, unspecified: Secondary | ICD-10-CM | POA: Diagnosis present

## 2024-09-10 LAB — I-STAT CHEM 8, ED
BUN: 13 mg/dL (ref 6–20)
Calcium, Ion: 1.09 mmol/L — ABNORMAL LOW (ref 1.15–1.40)
Chloride: 108 mmol/L (ref 98–111)
Creatinine, Ser: 1.5 mg/dL — ABNORMAL HIGH (ref 0.61–1.24)
Glucose, Bld: 177 mg/dL — ABNORMAL HIGH (ref 70–99)
HCT: 45 % (ref 39.0–52.0)
Hemoglobin: 15.3 g/dL (ref 13.0–17.0)
Potassium: 4.4 mmol/L (ref 3.5–5.1)
Sodium: 144 mmol/L (ref 135–145)
TCO2: 24 mmol/L (ref 22–32)

## 2024-09-10 LAB — COMPREHENSIVE METABOLIC PANEL WITH GFR
ALT: 13 U/L (ref 0–44)
AST: 20 U/L (ref 15–41)
Albumin: 3.9 g/dL (ref 3.5–5.0)
Alkaline Phosphatase: 43 U/L (ref 38–126)
Anion gap: 11 (ref 5–15)
BUN: 12 mg/dL (ref 6–20)
CO2: 22 mmol/L (ref 22–32)
Calcium: 8.9 mg/dL (ref 8.9–10.3)
Chloride: 107 mmol/L (ref 98–111)
Creatinine, Ser: 1.62 mg/dL — ABNORMAL HIGH (ref 0.61–1.24)
GFR, Estimated: 56 mL/min — ABNORMAL LOW (ref >60)
Glucose, Bld: 173 mg/dL — ABNORMAL HIGH (ref 70–99)
Potassium: 4.4 mmol/L (ref 3.5–5.1)
Sodium: 140 mmol/L (ref 135–145)
Total Bilirubin: 0.6 mg/dL (ref 0.0–1.2)
Total Protein: 6.7 g/dL (ref 6.5–8.1)

## 2024-09-10 LAB — ETHANOL: Alcohol, Ethyl (B): <15 mg/dL (ref <15)

## 2024-09-10 LAB — RAPID URINE DRUG SCREEN, HOSP PERFORMED
Amphetamines: NOT DETECTED
Barbiturates: NOT DETECTED
Benzodiazepines: NOT DETECTED
Cocaine: NOT DETECTED
Opiates: NOT DETECTED
Tetrahydrocannabinol: POSITIVE — AB

## 2024-09-10 LAB — PROTIME-INR
INR: 1 (ref 0.8–1.2)
Prothrombin Time: 13.6 s (ref 11.4–15.2)

## 2024-09-10 LAB — CBC
HCT: 44.5 % (ref 39.0–52.0)
Hemoglobin: 14.5 g/dL (ref 13.0–17.0)
MCH: 28.2 pg (ref 26.0–34.0)
MCHC: 32.6 g/dL (ref 30.0–36.0)
MCV: 86.4 fL (ref 80.0–100.0)
Platelets: 193 K/uL (ref 150–400)
RBC: 5.15 MIL/uL (ref 4.22–5.81)
RDW: 13.6 % (ref 11.5–15.5)
WBC: 6.1 K/uL (ref 4.0–10.5)
nRBC: 0 % (ref 0.0–0.2)

## 2024-09-10 LAB — URINALYSIS, ROUTINE W REFLEX MICROSCOPIC
Bacteria, UA: NONE SEEN
Bilirubin Urine: NEGATIVE
Glucose, UA: NEGATIVE mg/dL
Hgb urine dipstick: NEGATIVE
Ketones, ur: NEGATIVE mg/dL
Leukocytes,Ua: NEGATIVE
Nitrite: NEGATIVE
Protein, ur: NEGATIVE mg/dL
Specific Gravity, Urine: 1.039 — ABNORMAL HIGH (ref 1.005–1.030)
pH: 8 (ref 5.0–8.0)

## 2024-09-10 LAB — SAMPLE TO BLOOD BANK

## 2024-09-10 LAB — SALICYLATE LEVEL: Salicylate Lvl: <7 mg/dL — ABNORMAL LOW (ref 7.0–30.0)

## 2024-09-10 LAB — I-STAT CG4 LACTIC ACID, ED: Lactic Acid, Venous: 1.8 mmol/L (ref 0.5–1.9)

## 2024-09-10 LAB — ACETAMINOPHEN LEVEL: Acetaminophen (Tylenol), Serum: <10 ug/mL — ABNORMAL LOW (ref 10–30)

## 2024-09-10 MED ORDER — IOHEXOL 350 MG/ML SOLN
75.0000 mL | Freq: Once | INTRAVENOUS | Status: AC | PRN
Start: 2024-09-10 — End: 2024-09-10
  Administered 2024-09-10: 75 mL via INTRAVENOUS

## 2024-09-10 MED ORDER — GADOBUTROL 1 MMOL/ML IV SOLN
8.0000 mL | Freq: Once | INTRAVENOUS | Status: AC | PRN
  Administered 2024-09-10: 8 mL via INTRAVENOUS

## 2024-09-10 MED ORDER — ACETAMINOPHEN 650 MG RE SUPP
650.0000 mg | Freq: Once | RECTAL | Status: AC
  Administered 2024-09-10: 650 mg via RECTAL
  Filled 2024-09-10: qty 1

## 2024-09-10 MED ORDER — TETANUS-DIPHTH-ACELL PERTUSSIS 5-2.5-18.5 LF-MCG/0.5 IM SUSY
0.5000 mL | PREFILLED_SYRINGE | Freq: Once | INTRAMUSCULAR | Status: AC
  Administered 2024-09-10: 0.5 mL via INTRAMUSCULAR
  Filled 2024-09-10: qty 0.5

## 2024-09-10 MED ORDER — LACTATED RINGERS IV BOLUS
1000.0000 mL | Freq: Once | INTRAVENOUS | Status: AC
  Administered 2024-09-10: 1000 mL via INTRAVENOUS
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
## 2024-09-10 NOTE — ED Notes (Addendum)
 Pt found to be incont of urine again. Brief placed on pt. Urinal remains at bedside. Pt is awake and alert but still not answering questions
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
## 2024-09-10 NOTE — ED Notes (Signed)
 Patient transported to CT
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
## 2024-09-10 NOTE — ED Notes (Signed)
 Transported to MRI
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
## 2024-09-10 NOTE — ED Notes (Signed)
 CCMD contacted.

## 2024-09-11 ENCOUNTER — Encounter (HOSPITAL_COMMUNITY)

## 2024-09-11 ENCOUNTER — Encounter (HOSPITAL_COMMUNITY): Payer: Self-pay | Admitting: Student

## 2024-09-11 ENCOUNTER — Inpatient Hospital Stay (HOSPITAL_COMMUNITY)

## 2024-09-11 ENCOUNTER — Other Ambulatory Visit: Payer: Self-pay

## 2024-09-11 ENCOUNTER — Ambulatory Visit (HOSPITAL_COMMUNITY)

## 2024-09-11 DIAGNOSIS — Z21 Asymptomatic human immunodeficiency virus [HIV] infection status: Secondary | ICD-10-CM | POA: Insufficient documentation

## 2024-09-11 DIAGNOSIS — R4182 Altered mental status, unspecified: Secondary | ICD-10-CM

## 2024-09-11 DIAGNOSIS — R569 Unspecified convulsions: Secondary | ICD-10-CM | POA: Diagnosis not present

## 2024-09-11 DIAGNOSIS — D849 Immunodeficiency, unspecified: Secondary | ICD-10-CM | POA: Diagnosis present

## 2024-09-11 DIAGNOSIS — F4320 Adjustment disorder, unspecified: Secondary | ICD-10-CM | POA: Diagnosis not present

## 2024-09-11 DIAGNOSIS — R509 Fever, unspecified: Secondary | ICD-10-CM | POA: Diagnosis present

## 2024-09-11 DIAGNOSIS — Z7951 Long term (current) use of inhaled steroids: Secondary | ICD-10-CM | POA: Diagnosis not present

## 2024-09-11 DIAGNOSIS — F909 Attention-deficit hyperactivity disorder, unspecified type: Secondary | ICD-10-CM | POA: Diagnosis present

## 2024-09-11 DIAGNOSIS — Z91013 Allergy to seafood: Secondary | ICD-10-CM | POA: Diagnosis not present

## 2024-09-11 DIAGNOSIS — B20 Human immunodeficiency virus [HIV] disease: Secondary | ICD-10-CM | POA: Diagnosis not present

## 2024-09-11 DIAGNOSIS — Y92410 Unspecified street and highway as the place of occurrence of the external cause: Secondary | ICD-10-CM | POA: Diagnosis not present

## 2024-09-11 DIAGNOSIS — Z789 Other specified health status: Secondary | ICD-10-CM

## 2024-09-11 DIAGNOSIS — Z634 Disappearance and death of family member: Secondary | ICD-10-CM | POA: Diagnosis not present

## 2024-09-11 DIAGNOSIS — F12920 Cannabis use, unspecified with intoxication, uncomplicated: Secondary | ICD-10-CM | POA: Diagnosis present

## 2024-09-11 DIAGNOSIS — F4325 Adjustment disorder with mixed disturbance of emotions and conduct: Secondary | ICD-10-CM | POA: Diagnosis present

## 2024-09-11 DIAGNOSIS — F1210 Cannabis abuse, uncomplicated: Secondary | ICD-10-CM | POA: Diagnosis not present

## 2024-09-11 DIAGNOSIS — S60512A Abrasion of left hand, initial encounter: Secondary | ICD-10-CM | POA: Diagnosis present

## 2024-09-11 DIAGNOSIS — Z23 Encounter for immunization: Secondary | ICD-10-CM | POA: Diagnosis not present

## 2024-09-11 DIAGNOSIS — R410 Disorientation, unspecified: Secondary | ICD-10-CM

## 2024-09-11 DIAGNOSIS — J45909 Unspecified asthma, uncomplicated: Secondary | ICD-10-CM | POA: Diagnosis present

## 2024-09-11 DIAGNOSIS — Z79899 Other long term (current) drug therapy: Secondary | ICD-10-CM | POA: Diagnosis not present

## 2024-09-11 DIAGNOSIS — F940 Selective mutism: Secondary | ICD-10-CM | POA: Diagnosis present

## 2024-09-11 LAB — MENINGITIS/ENCEPHALITIS PANEL (CSF)

## 2024-09-11 LAB — RESP PANEL BY RT-PCR (RSV, FLU A&B, COVID)  RVPGX2
Influenza A by PCR: NEGATIVE
Influenza B by PCR: NEGATIVE
Resp Syncytial Virus by PCR: NEGATIVE
SARS Coronavirus 2 by RT PCR: NEGATIVE

## 2024-09-11 LAB — CBC WITH DIFFERENTIAL/PLATELET
Abs Immature Granulocytes: 0.01 K/uL (ref 0.00–0.07)
Basophils Absolute: 0 K/uL (ref 0.0–0.1)
Basophils Relative: 0 %
Eosinophils Absolute: 0.2 K/uL (ref 0.0–0.5)
Eosinophils Relative: 4 %
HCT: 40.9 % (ref 39.0–52.0)
Hemoglobin: 13 g/dL (ref 13.0–17.0)
Immature Granulocytes: 0 %
Lymphocytes Relative: 29 %
Lymphs Abs: 1.7 K/uL (ref 0.7–4.0)
MCH: 28.1 pg (ref 26.0–34.0)
MCHC: 31.8 g/dL (ref 30.0–36.0)
MCV: 88.3 fL (ref 80.0–100.0)
Monocytes Absolute: 0.7 K/uL (ref 0.1–1.0)
Monocytes Relative: 11 %
Neutro Abs: 3.1 K/uL (ref 1.7–7.7)
Neutrophils Relative %: 56 %
Platelets: 166 K/uL (ref 150–400)
RBC: 4.63 MIL/uL (ref 4.22–5.81)
RDW: 13.6 % (ref 11.5–15.5)
WBC: 5.7 K/uL (ref 4.0–10.5)
nRBC: 0 % (ref 0.0–0.2)

## 2024-09-11 LAB — PROTEIN AND GLUCOSE, CSF
Glucose, CSF: 54 mg/dL (ref 40–70)
Total  Protein, CSF: 73 mg/dL — ABNORMAL HIGH (ref 15–45)

## 2024-09-11 LAB — CRYPTOCOCCAL ANTIGEN, CSF: Crypto Ag: NEGATIVE

## 2024-09-11 LAB — CSF CELL COUNT WITH DIFFERENTIAL
RBC Count, CSF: 29 /mm3 — ABNORMAL HIGH
Tube #: 3
WBC, CSF: 1 /mm3 (ref 0–5)

## 2024-09-11 LAB — RPR: RPR Ser Ql: NONREACTIVE

## 2024-09-11 LAB — T-HELPER CELLS (CD4) COUNT (NOT AT ARMC)
CD4 % Helper T Cell: 34 % (ref 33–65)
CD4 T Cell Abs: 406 /uL (ref 400–1790)

## 2024-09-11 MED ORDER — SODIUM CHLORIDE 0.9 % IV BOLUS FOR AMBISOME
500.0000 mL | INTRAVENOUS | Status: DC
Start: 2024-09-11 — End: 2024-09-11

## 2024-09-11 MED ORDER — DIPHENHYDRAMINE HCL 50 MG/ML IJ SOLN
25.0000 mg | Freq: Every day | INTRAMUSCULAR | Status: DC | PRN
Start: 2024-09-11 — End: 2024-09-13

## 2024-09-11 MED ORDER — FLUCONAZOLE IN SODIUM CHLORIDE 400-0.9 MG/200ML-% IV SOLN
1200.0000 mg | INTRAVENOUS | Status: DC
Start: 2024-09-11 — End: 2024-09-11
  Administered 2024-09-11: 400 mg via INTRAVENOUS
  Filled 2024-09-11: qty 600

## 2024-09-11 MED ORDER — ENOXAPARIN SODIUM 40 MG/0.4ML IJ SOSY
40.0000 mg | PREFILLED_SYRINGE | Freq: Every day | INTRAMUSCULAR | Status: DC
Start: 2024-09-11 — End: 2024-09-11

## 2024-09-11 MED ORDER — LIDOCAINE 1 % OPTIME INJ - NO CHARGE
5.0000 mL | Freq: Once | INTRAMUSCULAR | Status: AC
  Administered 2024-09-11: 5 mL via INTRADERMAL
  Filled 2024-09-11: qty 6

## 2024-09-11 MED ORDER — VANCOMYCIN HCL 1500 MG/300ML IV SOLN
1500.0000 mg | Freq: Once | INTRAVENOUS | Status: AC
  Administered 2024-09-11: 1500 mg via INTRAVENOUS
  Filled 2024-09-11: qty 300

## 2024-09-11 MED ORDER — SODIUM CHLORIDE 0.9 % IV SOLN
2.0000 g | Freq: Two times a day (BID) | INTRAVENOUS | Status: DC
  Administered 2024-09-11 – 2024-09-12 (×2): 2 g via INTRAVENOUS
  Filled 2024-09-11 (×2): qty 20

## 2024-09-11 MED ORDER — ACETAMINOPHEN 325 MG PO TABS
650.0000 mg | ORAL_TABLET | Freq: Every day | ORAL | Status: DC | PRN
  Administered 2024-09-12: 650 mg via ORAL
  Filled 2024-09-11: qty 2

## 2024-09-11 MED ORDER — DEXTROSE 5% FOR FLUSHING BEFORE AND AFTER AMBISOME
10.0000 mL | INTRAVENOUS | Status: DC
  Filled 2024-09-11 (×2): qty 250

## 2024-09-11 MED ORDER — DEXTROSE 5% FOR FLUSHING BEFORE AND AFTER AMBISOME
10.0000 mL | INTRAVENOUS | Status: DC
  Filled 2024-09-11: qty 250

## 2024-09-11 MED ORDER — SODIUM CHLORIDE 0.9 % IV BOLUS
1000.0000 mL | Freq: Once | INTRAVENOUS | Status: AC
  Administered 2024-09-11: 1000 mL via INTRAVENOUS

## 2024-09-11 MED ORDER — LACTATED RINGERS IV SOLN: INTRAVENOUS | Status: DC

## 2024-09-11 MED ORDER — MEPERIDINE HCL 25 MG/ML IJ SOLN: 25.0000 mg | INTRAMUSCULAR | Status: DC | PRN

## 2024-09-11 MED ORDER — DEXTROSE 5 % IV SOLN
10.0000 mg/kg | Freq: Three times a day (TID) | INTRAVENOUS | Status: DC
  Administered 2024-09-11 – 2024-09-12 (×3): 800 mg via INTRAVENOUS
  Filled 2024-09-11 (×7): qty 16

## 2024-09-11 MED ORDER — DIPHENHYDRAMINE HCL 25 MG PO CAPS: 25.0000 mg | ORAL_CAPSULE | Freq: Every day | ORAL | Status: DC | PRN

## 2024-09-11 MED ORDER — SODIUM CHLORIDE 0.9 % IV BOLUS FOR AMBISOME: 500.0000 mL | INTRAVENOUS | Status: DC

## 2024-09-11 MED ORDER — SODIUM CHLORIDE 0.9 % IV SOLN
2.0000 g | Freq: Three times a day (TID) | INTRAVENOUS | Status: DC
  Administered 2024-09-11: 2 g via INTRAVENOUS
  Filled 2024-09-11: qty 12.5

## 2024-09-11 MED ORDER — VANCOMYCIN HCL IN DEXTROSE 1-5 GM/200ML-% IV SOLN
1000.0000 mg | Freq: Two times a day (BID) | INTRAVENOUS | Status: DC
  Administered 2024-09-11 – 2024-09-12 (×2): 1000 mg via INTRAVENOUS
  Filled 2024-09-11 (×2): qty 200

## 2024-09-11 MED ORDER — DEXTROSE 5 % IV SOLN
3.0000 mg/kg | INTRAVENOUS | Status: DC
  Filled 2024-09-11: qty 62.5

## 2024-09-11 MED ORDER — SODIUM CHLORIDE 0.9 % IV BOLUS FOR AMBISOME
500.0000 mL | INTRAVENOUS | Status: DC
  Administered 2024-09-11: 500 mL via INTRAVENOUS

## 2024-09-11 MED ORDER — SODIUM CHLORIDE 0.9 % IV SOLN
2.0000 g | INTRAVENOUS | Status: DC
  Administered 2024-09-11 (×2): 2 g via INTRAVENOUS
  Filled 2024-09-11 (×5): qty 2000

## 2024-09-11 NOTE — Progress Notes (Signed)
 LTM VIDEO EEG hooked up and running - no initial skin breakdown - push button tested - [facility] is NOTmonitoring due to patient in the ED.
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
## 2024-09-11 NOTE — Assessment & Plan Note (Addendum)
 HIV - well-controlled; undetectable viral load; CD4 count normal this year. Continue Dovato once cleared for p.o. Asthma (severe) - Budesonide-formoterol and Ventolin, add back once patient able to take. ADHD - takes Adderall outpatient
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
## 2024-09-11 NOTE — Progress Notes (Signed)
 SLP Cancellation Note  Patient Details Name: Jereld Presti MRN: [id number] DOB: 04-12-1986   Cancelled treatment:        Orders for clinical swallow evaluation received and appreciated. Attempted to see pt but must be kept NPO at present for planned LP today.  SLP will return as schedule permits once pt is cleared for PO trials.    Anette FORBES Grippe, MA, CCC-SLP Acute Rehabilitation Services Office: [phone] 09/11/2024, 8:45 AM

## 2024-09-11 NOTE — Progress Notes (Addendum)
 Night EEG tech defer to day team for two tech application.  Patient was stolidly non cooperative at approach for application of EEG leads .

## 2024-09-11 NOTE — ED Notes (Signed)
 Assigned float nurse.
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
## 2024-09-11 NOTE — Assessment & Plan Note (Addendum)
 Awake, alert, tracks objects with eyes but non-verbal/does not follow commands/not moving extremities.  Reassuringly patient protecting airway, hemodynamically stable.  - Admit to FMTS, progressive, attending McDiarmid - Plan for Neuro consult - Start empiric coverage of meningitis/encephalitis  -Vancomycin  (9/19-)  -Cefepime  (9/19-)  -Acyclovir  (9/19-)  -Ampicillin  (9/19-) - LP IR, labs including encephalitis/meningitis panel - EEG - RPR - Blood cultures - Repeat CD4, quantitative viral load - SLP eval

## 2024-09-11 NOTE — Progress Notes (Signed)
 Patient and equipment moved to [facility].  All leads attached. Study online . Patient and nurse made aware of [facility] monitoring. [facility] monitored, Event button test confirmed by [facility].

## 2024-09-11 NOTE — ED Notes (Signed)
 Pt pulling off pulse ox and starting to pull at monitoring equipment. Pt notified that it is necessary to keep all monitoring equipment on due to what he is being admitted for. Pt continues to pull off pulse ox after replacing multiple times.
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
## 2024-09-11 NOTE — Progress Notes (Signed)
 FM and IR teams unable to obtain consent from patient's emergency contact, Avelina Mulberry, for consent for lumbar puncture to further evaluate cause of altered mental status. After discussion with the IR team, we feel the procedure is required to further evaluate patient's persistent altered mental status. We will proceed with LP under 2-provider consent.

## 2024-09-11 NOTE — ED Provider Notes (Signed)
.  Critical Care  Performed by: Towana Ozell BROCKS, MD Authorized by: Towana Ozell BROCKS, MD   Critical care provider statement:    Critical care time (minutes):  45   Critical care time was exclusive of:  Separately billable procedures and treating other patients   Critical care was necessary to treat or prevent imminent or life-threatening deterioration of the following conditions:  Trauma and CNS failure or compromise   Critical care was time spent personally by me on the following activities:  Development of treatment plan with patient or surrogate, discussions with consultants, evaluation of patient's response to treatment, examination of patient, obtaining history from patient or surrogate, ordering and performing treatments and interventions, ordering and review of laboratory studies, ordering and review of radiographic studies, pulse oximetry, re-evaluation of patient's condition and review of old charts   I assumed direction of critical care for this patient from another provider in my specialty: no       Towana Ozell BROCKS, MD 09/11/24 [phone]

## 2024-09-11 NOTE — Progress Notes (Signed)
 Routine EEG completed, results pending Neurology review and interpretation

## 2024-09-11 NOTE — Assessment & Plan Note (Signed)
 Awake, alert, tracks objects with eyes but non-verbal/does not follow commands/not moving extremities.  Reassuringly patient protecting airway, hemodynamically stable.  - Neurology consult, Pending recomendation - Start empiric coverage of meningitis/encephalitis  -Vancomycin  (9/19-)  -Cefepime  (9/19-)  -Acyclovir  (9/19-) - RPR - Blood cultures - Repeat CD4, quantitative viral load - SLP eval
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
## 2024-09-11 NOTE — Procedures (Signed)
 PROCEDURE SUMMARY:  Successful fluoroscopic guided lumbar puncture. No immediate complications.  Pt tolerated well.   EBL = none  Please see full dictation in imaging section of Epic for procedure details.
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
## 2024-09-11 NOTE — Assessment & Plan Note (Signed)
 Asthma (severe) - Budesonide-formoterol and Ventolin, add back once patient able to take. ADHD - takes Adderall outpatient

## 2024-09-11 NOTE — Assessment & Plan Note (Signed)
 History of HIV on Dovato (last viral load undetectable in January 2025)  - ID consulted, appreciated recomendations - Penind LP IR with IIR today , labs including encephalitis/meningitis panel - Pending RPR result - Pending Blood culture , Repeat CD4, quantitative viral load - Continue Dovato once cleared for p.o

## 2024-09-12 ENCOUNTER — Inpatient Hospital Stay (HOSPITAL_COMMUNITY)

## 2024-09-12 ENCOUNTER — Encounter (HOSPITAL_COMMUNITY): Payer: Self-pay | Admitting: Student

## 2024-09-12 DIAGNOSIS — F940 Selective mutism: Secondary | ICD-10-CM | POA: Diagnosis not present

## 2024-09-12 DIAGNOSIS — R4182 Altered mental status, unspecified: Secondary | ICD-10-CM | POA: Diagnosis not present

## 2024-09-12 DIAGNOSIS — R569 Unspecified convulsions: Secondary | ICD-10-CM | POA: Diagnosis not present

## 2024-09-12 DIAGNOSIS — F4320 Adjustment disorder, unspecified: Secondary | ICD-10-CM

## 2024-09-12 DIAGNOSIS — F4325 Adjustment disorder with mixed disturbance of emotions and conduct: Secondary | ICD-10-CM

## 2024-09-12 LAB — CBC WITH DIFFERENTIAL/PLATELET
Abs Immature Granulocytes: 0.01 K/uL (ref 0.00–0.07)
Basophils Absolute: 0 K/uL (ref 0.0–0.1)
Basophils Relative: 0 %
Eosinophils Absolute: 0.3 K/uL (ref 0.0–0.5)
Eosinophils Relative: 7 %
HCT: 39.3 % (ref 39.0–52.0)
Hemoglobin: 12.9 g/dL — ABNORMAL LOW (ref 13.0–17.0)
Immature Granulocytes: 0 %
Lymphocytes Relative: 25 %
Lymphs Abs: 1.2 K/uL (ref 0.7–4.0)
MCH: 28 pg (ref 26.0–34.0)
MCHC: 32.8 g/dL (ref 30.0–36.0)
MCV: 85.2 fL (ref 80.0–100.0)
Monocytes Absolute: 0.4 K/uL (ref 0.1–1.0)
Monocytes Relative: 9 %
Neutro Abs: 2.9 K/uL (ref 1.7–7.7)
Neutrophils Relative %: 59 %
Platelets: 163 K/uL (ref 150–400)
RBC: 4.61 MIL/uL (ref 4.22–5.81)
RDW: 13.7 % (ref 11.5–15.5)
WBC: 4.9 K/uL (ref 4.0–10.5)
nRBC: 0 % (ref 0.0–0.2)

## 2024-09-12 LAB — BASIC METABOLIC PANEL WITH GFR
Anion gap: 11 (ref 5–15)
BUN: 6 mg/dL (ref 6–20)
CO2: 24 mmol/L (ref 22–32)
Calcium: 8.3 mg/dL — ABNORMAL LOW (ref 8.9–10.3)
Chloride: 104 mmol/L (ref 98–111)
Creatinine, Ser: 1.15 mg/dL (ref 0.61–1.24)
GFR, Estimated: >60 mL/min (ref >60)
Glucose, Bld: 80 mg/dL (ref 70–99)
Potassium: 3.6 mmol/L (ref 3.5–5.1)
Sodium: 139 mmol/L (ref 135–145)

## 2024-09-12 LAB — HSV 1/2 PCR, CSF
HSV-1 DNA: NEGATIVE
HSV-2 DNA: NEGATIVE

## 2024-09-12 LAB — HIV-1 RNA QUANT-NO REFLEX-BLD
HIV 1 RNA Quant: <20 {copies}/mL
LOG10 HIV-1 RNA: UNDETERMINED {Log_copies}/mL

## 2024-09-12 LAB — MAGNESIUM: Magnesium: 1.5 mg/dL — ABNORMAL LOW (ref 1.7–2.4)

## 2024-09-12 MED ORDER — FLUTICASONE FUROATE-VILANTEROL 200-25 MCG/ACT IN AEPB
1.0000 | INHALATION_SPRAY | Freq: Every day | RESPIRATORY_TRACT | Status: DC
  Administered 2024-09-12: 1 via RESPIRATORY_TRACT
  Filled 2024-09-12: qty 28

## 2024-09-12 MED ORDER — MAGNESIUM SULFATE IN D5W 1-5 GM/100ML-% IV SOLN
1.0000 g | Freq: Once | INTRAVENOUS | Status: AC
  Administered 2024-09-12: 1 g via INTRAVENOUS
  Filled 2024-09-12: qty 100

## 2024-09-12 MED ORDER — ACETAMINOPHEN 325 MG PO TABS: 650.0000 mg | ORAL_TABLET | Freq: Four times a day (QID) | ORAL | Status: DC | PRN

## 2024-09-12 MED ORDER — IBUPROFEN 600 MG PO TABS: 600.0000 mg | ORAL_TABLET | Freq: Four times a day (QID) | ORAL | Status: DC | PRN

## 2024-09-12 MED ORDER — IBUPROFEN 600 MG PO TABS
600.0000 mg | ORAL_TABLET | Freq: Four times a day (QID) | ORAL | Status: DC | PRN
  Administered 2024-09-12: 600 mg via ORAL
  Filled 2024-09-12: qty 1

## 2024-09-12 MED ORDER — DOLUTEGRAVIR-LAMIVUDINE 50-300 MG PO TABS
1.0000 | ORAL_TABLET | Freq: Every day | ORAL | Status: DC
  Administered 2024-09-12: 1 via ORAL
  Filled 2024-09-12: qty 1

## 2024-09-12 NOTE — Progress Notes (Signed)
 Patient stating he was not treated properly by staff during his hospital stay. I offered for him to speak to management about his concerns. [name] declined, stating he will initiate that himself.

## 2024-09-12 NOTE — Progress Notes (Signed)
 SLP Cancellation Note  Patient Details Name: [name] MRN: [id number] DOB: January 17, 1986   Cancelled treatment:       Reason Eval/Treat Not Completed: SLP screened, no needs identified, will sign off BSE orders noted from 09/11/24 however patient passed [place] with RN today and is on regular solids, thin liquids and tolerating without difficulty. SLP to s/o at this time but please reorder if any swallowing concerns arise. Thank you for this consult!   Norleen IVAR Blase, MA, CCC-SLP Speech Therapy

## 2024-09-12 NOTE — Progress Notes (Signed)
 Patient has been non verbal throughout the night.  As this nurse was walking out of room he said thank you.  I said so you have been able to talk this entire time.  He laughed loud and said no one knows what is getting ready to come.  I said what do you mean?  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  I'm not telling you.  You will just have to wait and see.  This whole place is getting ready to come down.  I asked patient if he wanted me to call Dr to tell them he was communicating now and he stated No, I want all of them in here together.   I want them all to experience what is going down.  He was repeatedly saying you just wait and see.  He then started naming off staff names and asking if they were working tonight.  Family medicine notified.
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
## 2024-09-12 NOTE — Progress Notes (Signed)
 Patient denies having made threats to nightshift at this time. Alert/Oriented x 3, pleasant, calm and cooperative.

## 2024-09-12 NOTE — Assessment & Plan Note (Addendum)
 History of HIV on Dovato  (last viral load undetectable in January 2025)  - Restarted on Dovato  daily
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
## 2024-09-12 NOTE — Plan of Care (Signed)

## 2024-09-12 NOTE — Progress Notes (Signed)
 Provided patient with location of EMS pickup per his request.

## 2024-09-12 NOTE — Progress Notes (Signed)
 Daily Progress Note Intern Pager: [phone]  Patient name: [name] Medical record number: [id number] Date of birth: Apr 14, 1986 Age: 38 y.o. Gender: male  Primary Care Provider: Patient, No Pcp Per Consultants: Neurology (signed-off), Infectious Disease, Psychiatry Code Status: FULL  Pt Overview and Major Events to Date:  - 9/18: Admitted  Medical Decision Making:  Sylas Twombly is a 38 y.o. male with a PMH of HIV, asthma, ADHD admitted for AMS. CSF unremarkable with negative meningitis/encephalitis panel; antibiotic/antiviral course stopped accordingly with approval from ID (Dr. Sabina Manandhar). CD4 count of 406; viral load undetectable. EEG unremarkable. Psychiatry consulted. Pt speaking to nurse this morning 9/20; see note by Nat Mayer, RN. Assessment & Plan Altered mental status Unable to assess well given patient nonverbal; does track objects with eyes and made facial expressions in response to questions. - Neurology consulted, not recommending further workup, recommending psych consult - Psychiatry consulted, no specific recommendations at this time but planning to follow-up later today - Antibiotics/antivirals discontinued - Pain: Tylenol  650 mg q6h prn, ibuprofen  600 mg q6h prn - SLP eval - Blood cultures: No growth 1 day - CSF cultures: No growth < 24 hours - Mag 1.5, repleting with 1g IV - HSV pending - Labs: AM BMP, Mag, CBC HIV (human immunodeficiency virus infection) (HCC) History of HIV on Dovato  (last viral load undetectable in January 2025)  - Restarted on Dovato  daily Chronic health problem Asthma (severe) - On Symbicort  outpatient; added Breo Ellipta  (formula equivalent) while inpatient  ADHD - takes Adderall outpatient, holding inpatient  FEN/GI: Passed bedside swallow; Regular diet ordered today PPx: Lovenox  Dispo: Pending psychiatric workup.  Subjective:  Patient seen alongside Dr. Janna and Dr. McDiarmid given earlier nursing note  from Nat Mayer.  Patient would not answer questions verbally or with gestures.  When asked by Dr. McDiarmid about whether he had any thoughts of wanting to hurt himself or others, patient grimaced.  Otherwise no responses.  Objective: Temp:  [97.6 F (36.4 C)-98.5 F (36.9 C)] 98.5 F (36.9 C) (09/20 0357) Pulse Rate:  [49-69] 62 (09/20 0357) Resp:  [14-20] 18 (09/20 0357) BP: (99-144)/(65-103) 99/65 (09/20 0357) SpO2:  [98 %-100 %] 98 % (09/20 0357) Physical Exam: General: Patient lying down in bed.  Tracking objects with eyes, not talking.  No acute distress. Cardiovascular: Regular rate and rhythm, no murmurs/rubs/gallops. Respiratory: Normal work of breathing on room air. Clear to auscultation bilaterally; no wheezes, crackles. Abdomen: Bowel sounds present and normoactive bilaterally. Soft, nondistended, nontender. Extremities: Skin warm, dry. No bilateral lower extremity edema. Neuro: Alert, nonverbal.  Did respond to question with facial grimace.  Otherwise not responding to questions.  Is tracking objects in room with eyes.  Laboratory: Most recent CBC Lab Results  Component Value Date   WBC 4.9 09/12/2024   HGB 12.9 (L) 09/12/2024   HCT 39.3 09/12/2024   MCV 85.2 09/12/2024   PLT 163 09/12/2024   Most recent BMP    Latest Ref Rng & Units 09/12/2024    5:26 AM  BMP  Glucose 70 - 99 mg/dL 80   BUN 6 - 20 mg/dL 6   Creatinine 9.38 - 8.75 mg/dL 8.84   Sodium 864 - 854 mmol/L 139   Potassium 3.5 - 5.1 mmol/L 3.6   Chloride 98 - 111 mmol/L 104   CO2 22 - 32 mmol/L 24   Calcium 8.9 - 10.3 mg/dL 8.3   Mag: 1.5  CSF: No WBC, no organisms; negative cryptococcal antigen Meningitis/encephalitis  panel: negative Resp Panel: Negative RPR: Nonreactive CD4: 406  HIV RNA <20 (undetectable)   Imaging/Diagnostic Tests: EEG 9/20: No seizures, epileptiform discharges; no significant findings.  Larraine Palma, MD 09/12/2024, 8:40 AM  PGY-1, [facility] Family  Medicine FPTS Intern pager: [phone], text pages welcome Secure chat group [facility] Teaching Service

## 2024-09-12 NOTE — Assessment & Plan Note (Addendum)
 Asthma (severe) - On Symbicort  outpatient; added Breo Ellipta  (formula equivalent) while inpatient  ADHD - takes Adderall outpatient, holding inpatient

## 2024-09-12 NOTE — Progress Notes (Signed)
 LTM VIDEO EEG discontinued - no skin breakdown at [facility].
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
## 2024-09-12 NOTE — Assessment & Plan Note (Addendum)
 Unable to assess well given patient nonverbal; does track objects with eyes and made facial expressions in response to questions. - Neurology consulted, not recommending further workup, recommending psych consult - Psychiatry consulted, no specific recommendations at this time but planning to follow-up later today - Antibiotics/antivirals discontinued - Pain: Tylenol  650 mg q6h prn, ibuprofen  600 mg q6h prn - SLP eval - Blood cultures: No growth 1 day - CSF cultures: No growth < 24 hours - Mag 1.5, repleting with 1g IV - HSV pending - Labs: AM BMP, Mag, CBC
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
## 2024-09-12 NOTE — Consult Note (Signed)
 This provider went to assess the client and when asked, How are you doing.  He responded, What do you think, Warren? To which this provider responded with I don't know, this is the first time I've met you.  He did imply he was not happy with how he was been treated in the hospital to which this provider apologized for any behaviors of others.  Then, this provider let him know that she was here to help him to which he stated, You don't want to help me.  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  Again, the provider asked questions that were answered with an abrupt, no.  Then, he stated, I will use silence to speak for me, you know what that is?  You should.  This provider asked if there was anything she could do to help or make his stay better to which he did not respond.  She also added, If you change your mind, just let the nurse know and I will be happy to come back.  No response.  The attempted assessment was ended.  During the assessment, his affect was irritable to angry with eyes looking out the window most of the time with brief glances at the provider.    If he decides he would like to talk or voices any mental health needs, please re-consult.  Sharlot Becker, PMHNP
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
## 2024-09-12 NOTE — Plan of Care (Signed)
 Chart review note  Nursing reports patient having conversation with her as documented in the progress note from 09/12/2024 at 6:48 AM.  Patient's CSF reveals elevated protein at 73 with only 1 white cell.  Meningitis/encephalitis panel negative.  Further workup per ID  LTM EEG is normal.  Presentation likely consistent with a psychiatric etiology.  Please consider psychiatry consultation.  Inpatient neurology will be available as needed  This plan was relayed to the care team resident MDs via secure chat  Eligio Lav, MD Neurology

## 2024-09-14 ENCOUNTER — Encounter: Payer: Self-pay | Admitting: Infectious Disease

## 2024-09-14 LAB — GC/CHLAMYDIA PROBE AMP (~~LOC~~) NOT AT ARMC
Chlamydia: NEGATIVE
Comment: NEGATIVE
Comment: NORMAL
Neisseria Gonorrhea: NEGATIVE

## 2024-09-15 LAB — CSF CULTURE W GRAM STAIN
Culture: NO GROWTH
Gram Stain: NONE SEEN

## 2024-09-15 LAB — CYTOLOGY - NON PAP

## 2024-09-16 LAB — CULTURE, BLOOD (ROUTINE X 2)
Culture: NO GROWTH
Culture: NO GROWTH
